# Patient Record
Sex: Female | Born: 1940 | Race: Black or African American | Hispanic: No | Marital: Single | Smoking: Never smoker
Health system: Southern US, Community
[De-identification: ages and names within clinical notes are randomized; demographics above are authoritative.]

## PROBLEM LIST (undated history)

## (undated) DIAGNOSIS — I10 Essential (primary) hypertension: Secondary | ICD-10-CM

## (undated) HISTORY — DX: Essential (primary) hypertension: I10

---

## 2020-01-13 ENCOUNTER — Other Ambulatory Visit: Payer: Self-pay

## 2020-01-14 ENCOUNTER — Encounter: Payer: Self-pay | Admitting: Nurse Practitioner

## 2020-01-14 ENCOUNTER — Ambulatory Visit (INDEPENDENT_AMBULATORY_CARE_PROVIDER_SITE_OTHER): Payer: Medicare Other | Admitting: Nurse Practitioner

## 2020-01-14 VITALS — BP 120/72 | HR 60 | Temp 96.1°F | Ht 61.5 in | Wt 92.6 lb

## 2020-01-14 DIAGNOSIS — F0390 Unspecified dementia without behavioral disturbance: Secondary | ICD-10-CM

## 2020-01-14 DIAGNOSIS — Z13220 Encounter for screening for lipoid disorders: Secondary | ICD-10-CM

## 2020-01-14 DIAGNOSIS — Z23 Encounter for immunization: Secondary | ICD-10-CM

## 2020-01-14 DIAGNOSIS — I10 Essential (primary) hypertension: Secondary | ICD-10-CM | POA: Diagnosis not present

## 2020-01-14 DIAGNOSIS — Z136 Encounter for screening for cardiovascular disorders: Secondary | ICD-10-CM

## 2020-01-14 NOTE — Progress Notes (Signed)
Subjective:  Patient ID: Micah Noel, female    DOB: 04-02-1940  Age: 79 y.o. MRN: [id number]  CC: Establish Care (memory difficulty)  HPI Accompanied by Sister: Ms. Ladell Pier Ms. Kristiansen lives with her younger sister and brother in [employer]. They recently moved from [place] to [place]. [name] is her primary caregiver. [name] states she was diagnosed with a mental retardation at age 31. Shavontae has always been under the care of a family member: mother in [place] then [name]. [name] reports reports decline in memory (short-term), increase hoarding, agitation in evening, losing personal items, disorientation at home, augmentative if confronted, and repetitiveness x 35yrs. She denies any verbal or physical aggression. She denies any wandering outside of home. They have an alarm on the door if she attempts. Bibi is always under supervision by [name] or brother in [employer]. Previous participation in Day program in [place], but that was discontinued due to [place] She has had no fall in 1year, no change in gait or speech or muscle weakness. No hospitalization in last 3yrs. Averyanna is continent of bowel and bladder. She is about take care of ADLs like bathing, dressing, and feeding; without assistance. Tomeko endorse periods of sadness about death of her parents. She denies any thoughts of hurting herself or dying. She denies any angry feelings. [name] has contact social services and disability department in her county to enroll Lanna to local adult daycare program. She is not interested in use of medication at this time.  Reviewed past Medical, Social and Family history today.  Outpatient Medications Prior to Visit  Medication Sig Dispense Refill  . amLODipine (NORVASC) 2.5 MG tablet Take 2.5 mg by mouth daily.    Marland Kitchen aspirin EC 81 MG tablet Take 81 mg by mouth daily. Swallow whole.    . Multiple Vitamin (ONE-A-DAY ESSENTIAL PO) Take by mouth.     No facility-administered medications prior to visit.    ROS See HPI  Objective:  BP 120/72 (BP Location: Left Arm, Patient Position: Sitting, Cuff Size: Normal)   Pulse 60   Temp (!) 96.1 F (35.6 C) (Temporal)   Ht 5' 1.5" (1.562 m)   Wt 92 lb 9.6 oz (42 kg)   SpO2 100%   BMI 17.21 kg/m   Physical Exam Vitals reviewed. Exam conducted with a chaperone present.  Constitutional:      General: She is not in acute distress. Cardiovascular:     Rate and Rhythm: Normal rate and regular rhythm.     Pulses: Normal pulses.     Heart sounds: Normal heart sounds.  Pulmonary:     Effort: Pulmonary effort is normal.     Breath sounds: Normal breath sounds.  Chest:     Breasts: Breasts are symmetrical.        Right: Normal.        Left: Normal.  Musculoskeletal:        General: Normal range of motion.     Cervical back: Normal range of motion and neck supple.  Lymphadenopathy:     Cervical: No cervical adenopathy.     Upper Body:     Right upper body: No supraclavicular, axillary or pectoral adenopathy.     Left upper body: No supraclavicular, axillary or pectoral adenopathy.  Skin:    General: Skin is warm and dry.  Neurological:     Mental Status: She is alert.     Motor: No weakness or tremor.     Coordination: Coordination is intact.  Gait: Gait is intact.     Comments: Oriented to person, family and place  Psychiatric:        Mood and Affect: Affect is blunt.        Speech: Speech normal.        Behavior: Behavior is cooperative.        Thought Content: Thought content is not paranoid.        Cognition and Memory: Cognition is impaired. Memory is impaired. She exhibits impaired recent memory and impaired remote memory.    Assessment & Plan:  This visit occurred during the SARS-CoV-2 public health emergency.  Safety protocols were in place, including screening questions prior to the visit, additional usage of staff PPE, and extensive cleaning of exam room while observing appropriate contact time as indicated for  disinfecting solutions.   Keishia was seen today for establish care.  Diagnoses and all orders for this visit:  Dementia without behavioral disturbance, unspecified dementia type ([place]) -     CBC with Differential/Platelet; Future -     Comprehensive metabolic panel; Future -     TSH; Future  Influenza vaccine needed -     Flu Vaccine QUAD High Dose(Fluad)  HTN (hypertension), benign -     Comprehensive metabolic panel; Future  Encounter for lipid screening for cardiovascular disease -     Lipid panel; Future  Sign medical release to get records from previous pcp.  Problem List Items Addressed This Visit    None    Visit Diagnoses    Dementia without behavioral disturbance, unspecified dementia type ([place])    -  Primary   Relevant Orders   CBC with Differential/Platelet   Comprehensive metabolic panel   TSH   Influenza vaccine needed       Relevant Orders   Flu Vaccine QUAD High Dose(Fluad)   HTN (hypertension), benign       Relevant Medications   amLODipine (NORVASC) 2.5 MG tablet   aspirin EC 81 MG tablet   Other Relevant Orders   Comprehensive metabolic panel   Encounter for lipid screening for cardiovascular disease       Relevant Orders   Lipid panel      Follow-up: Return in about 3 months (around 04/15/2020) for HTN (58mins).  Wilfred Lacy, NP

## 2020-01-14 NOTE — Patient Instructions (Addendum)
Sign medical release to get records from previous pcp.  Schedule lab appt for sometime next week.

## 2020-01-18 ENCOUNTER — Other Ambulatory Visit: Payer: Self-pay

## 2020-01-18 ENCOUNTER — Other Ambulatory Visit (INDEPENDENT_AMBULATORY_CARE_PROVIDER_SITE_OTHER): Payer: Medicare Other

## 2020-01-18 DIAGNOSIS — I10 Essential (primary) hypertension: Secondary | ICD-10-CM | POA: Diagnosis not present

## 2020-01-18 DIAGNOSIS — R7989 Other specified abnormal findings of blood chemistry: Secondary | ICD-10-CM

## 2020-01-18 DIAGNOSIS — Z136 Encounter for screening for cardiovascular disorders: Secondary | ICD-10-CM

## 2020-01-18 DIAGNOSIS — Z13220 Encounter for screening for lipoid disorders: Secondary | ICD-10-CM

## 2020-01-18 DIAGNOSIS — F0390 Unspecified dementia without behavioral disturbance: Secondary | ICD-10-CM

## 2020-01-18 LAB — COMPREHENSIVE METABOLIC PANEL
ALT: 15 U/L (ref 0–35)
AST: 22 U/L (ref 0–37)
Albumin: 4 g/dL (ref 3.5–5.2)
Alkaline Phosphatase: 73 U/L (ref 39–117)
BUN: 18 mg/dL (ref 6–23)
CO2: 28 mEq/L (ref 19–32)
Calcium: 9.7 mg/dL (ref 8.4–10.5)
Chloride: 102 mEq/L (ref 96–112)
Creatinine, Ser: 1.72 mg/dL — ABNORMAL HIGH (ref 0.40–1.20)
GFR: 27.83 mL/min — ABNORMAL LOW (ref >60.00)
Glucose, Bld: 96 mg/dL (ref 70–99)
Potassium: 4.8 mEq/L (ref 3.5–5.1)
Sodium: 137 mEq/L (ref 135–145)
Total Bilirubin: 0.4 mg/dL (ref 0.2–1.2)
Total Protein: 7.4 g/dL (ref 6.0–8.3)

## 2020-01-18 LAB — LIPID PANEL
Cholesterol: 245 mg/dL — ABNORMAL HIGH (ref 0–200)
HDL: 96.5 mg/dL (ref >39.00)
LDL Cholesterol: 137 mg/dL — ABNORMAL HIGH (ref 0–99)
NonHDL: 148.84
Total CHOL/HDL Ratio: 3
Triglycerides: 59 mg/dL (ref 0.0–149.0)
VLDL: 11.8 mg/dL (ref 0.0–40.0)

## 2020-01-18 LAB — TSH: TSH: 5.61 u[IU]/mL — ABNORMAL HIGH (ref 0.35–4.50)

## 2020-01-18 LAB — CBC WITH DIFFERENTIAL/PLATELET
Basophils Absolute: 0.1 10*3/uL (ref 0.0–0.1)
Basophils Relative: 1.2 % (ref 0.0–3.0)
Eosinophils Absolute: 0.2 10*3/uL (ref 0.0–0.7)
Eosinophils Relative: 3.6 % (ref 0.0–5.0)
HCT: 41.5 % (ref 36.0–46.0)
Hemoglobin: 13.5 g/dL (ref 12.0–15.0)
Lymphocytes Relative: 19.6 % (ref 12.0–46.0)
Lymphs Abs: 1.3 10*3/uL (ref 0.7–4.0)
MCHC: 32.4 g/dL (ref 30.0–36.0)
MCV: 91.1 fl (ref 78.0–100.0)
Monocytes Absolute: 0.5 10*3/uL (ref 0.1–1.0)
Monocytes Relative: 7.7 % (ref 3.0–12.0)
Neutro Abs: 4.5 10*3/uL (ref 1.4–7.7)
Neutrophils Relative %: 67.9 % (ref 43.0–77.0)
Platelets: 270 10*3/uL (ref 150.0–400.0)
RBC: 4.56 Mil/uL (ref 3.87–5.11)
RDW: 12.5 % (ref 11.5–15.5)
WBC: 6.7 10*3/uL (ref 4.0–10.5)

## 2020-01-18 NOTE — Addendum Note (Signed)
Addended by: Leana Gamer on: 01/18/2020 03:31 PM   Modules accepted: Orders

## 2020-02-01 ENCOUNTER — Telehealth: Payer: Self-pay | Admitting: Nurse Practitioner

## 2020-02-01 NOTE — Telephone Encounter (Signed)
Patient brought form: [facility] Medical Examination Report to be completed by provider.  No charge form available at time of drop. Patient is aware there may be a charge.  Forms have been placed in provider's file in front office. Please call patient when forms are completed.

## 2020-02-07 NOTE — Telephone Encounter (Signed)
Forms placed on [employer] desk last week.

## 2020-02-15 NOTE — Telephone Encounter (Signed)
Ruth Nelson came by to pick up paperwork because she said someone called her and said it was ready, We didn't find anything up front, She just asked if it could be mailed to address on profile

## 2020-02-15 NOTE — Telephone Encounter (Signed)
Forms placed in the mail.

## 2020-02-17 ENCOUNTER — Telehealth: Payer: Self-pay | Admitting: Nurse Practitioner

## 2020-02-17 ENCOUNTER — Other Ambulatory Visit (INDEPENDENT_AMBULATORY_CARE_PROVIDER_SITE_OTHER): Payer: Medicare Other

## 2020-02-17 ENCOUNTER — Other Ambulatory Visit: Payer: Self-pay

## 2020-02-17 DIAGNOSIS — R7989 Other specified abnormal findings of blood chemistry: Secondary | ICD-10-CM

## 2020-02-17 DIAGNOSIS — I10 Essential (primary) hypertension: Secondary | ICD-10-CM | POA: Diagnosis not present

## 2020-02-17 LAB — BASIC METABOLIC PANEL
BUN: 14 mg/dL (ref 6–23)
CO2: 31 mEq/L (ref 19–32)
Calcium: 10 mg/dL (ref 8.4–10.5)
Chloride: 105 mEq/L (ref 96–112)
Creatinine, Ser: 1.53 mg/dL — ABNORMAL HIGH (ref 0.40–1.20)
GFR: 32.01 mL/min — ABNORMAL LOW (ref >60.00)
Glucose, Bld: 101 mg/dL — ABNORMAL HIGH (ref 70–99)
Potassium: 4.7 mEq/L (ref 3.5–5.1)
Sodium: 142 mEq/L (ref 135–145)

## 2020-02-17 NOTE — Telephone Encounter (Signed)
Patients sister [name] is requesting a letter to become Power of [place]. The letter needs to state that patient is not able to take care of herself. She said that the letter is for an attorney. She is aware that provider is out of the office. Please call her at [phone] if you have any questions.

## 2020-02-18 LAB — THYROID PANEL WITH TSH
Free Thyroxine Index: 2.1 (ref 1.4–3.8)
T3 Uptake: 26 % (ref 22–35)
T4, Total: 8.2 ug/dL (ref 5.1–11.9)
TSH: 8.48 mIU/L — ABNORMAL HIGH (ref 0.40–4.50)

## 2020-02-21 ENCOUNTER — Other Ambulatory Visit: Payer: Self-pay | Admitting: Family

## 2020-02-21 DIAGNOSIS — E038 Other specified hypothyroidism: Secondary | ICD-10-CM

## 2020-02-21 MED ORDER — LEVOTHYROXINE SODIUM 50 MCG PO TABS: 25.0000 ug | ORAL_TABLET | Freq: Every day | ORAL | 3 refills | Status: DC

## 2020-02-23 NOTE — Telephone Encounter (Signed)
Have we received any records from her previous pcp? If not, ask sister if she has any documentation at home indicating Ruth Nelson's medical diagnosis or medical history? If not, we need to fax medical release form again.

## 2020-03-01 NOTE — Telephone Encounter (Signed)
We have received some forms but I do not believe we have received them all. I will have them send the request again.

## 2020-03-14 ENCOUNTER — Ambulatory Visit: Payer: Medicare Other

## 2020-03-30 ENCOUNTER — Telehealth: Payer: Self-pay | Admitting: Nurse Practitioner

## 2020-03-30 NOTE — Telephone Encounter (Signed)
Pt sister Ladell Pier needs a statement that pt is not capable of handling her own matters, she said she needs this to get power of attorney. Call back: [phone]

## 2020-04-03 NOTE — Progress Notes (Signed)
Subjective:   Ruth Nelson is a 80 y.o. female who presents for an Initial Medicare Annual Wellness Visit.  Review of Systems     Cardiac Risk Factors include: advanced age (>34mn, >>29women);hypertension     Objective:    Today's Vitals   04/04/20 1003  BP: (!) 144/72  Pulse: 64  Resp: 16  Temp: (!) 96.6 F (35.9 C)  TempSrc: Temporal  Weight: 99 lb 3.2 oz (45 kg)  Height: 5' 1.5" (1.562 m)   Body mass index is 18.44 kg/m.  Advanced Directives 04/04/2020  Does Patient Have a Medical Advance Directive? No  Would patient like information on creating a medical advance directive? No - Patient declined    Current Medications (verified) Outpatient Encounter Medications as of 04/04/2020  Medication Sig   amLODipine (NORVASC) 2.5 MG tablet Take 2.5 mg by mouth daily.   aspirin EC 81 MG tablet Take 81 mg by mouth daily. Swallow whole.   levothyroxine (SYNTHROID) 50 MCG tablet Take 0.5 tablets (25 mcg total) by mouth daily before breakfast.   Multiple Vitamin (ONE-A-DAY ESSENTIAL PO) Take by mouth.   No facility-administered encounter medications on file as of 04/04/2020.    Allergies (verified) Patient has no known allergies.   History: History reviewed. No pertinent past medical history. History reviewed. No pertinent surgical history. History reviewed. No pertinent family history. Social History   Socioeconomic History   Marital status: Single    Spouse name: Not on file   Number of children: Not on file   Years of education: Not on file   Highest education level: Not on file  Occupational History   Not on file  Tobacco Use   Smoking status: Never Smoker   Smokeless tobacco: Never Used  Vaping Use   Vaping Use: Never used  Substance and Sexual Activity   Alcohol use: Never   Drug use: Never   Sexual activity: Not Currently  Other Topics Concern   Not on file  Social History Narrative   Not on file   Social Determinants of Health    Financial Resource Strain: Low Risk    Difficulty of Paying Living Expenses: Not hard at all  Food Insecurity: No Food Insecurity   Worried About RCharity fundraiserin the Last Year: Never true   [place] the Last Year: Never true  Transportation Needs: No Transportation Needs   Lack of Transportation (Medical): No   Lack of Transportation (Non-Medical): No  Physical Activity: Sufficiently Active   Days of Exercise per Week: 5 days   Minutes of Exercise per Session: 30 min  Stress: No Stress Concern Present   Feeling of Stress : Not at all  Social Connections: Moderately Isolated   Frequency of Communication with Friends and Family: More than three times a week   Frequency of Social Gatherings with Friends and Family: More than three times a week   Attends Religious Services: 1 to 4 times per year   Active Member of [employer] Organizations: No   Attends [employer] therapist Never   Marital Status: Never married    Tobacco Counseling Counseling given: Not Answered   Clinical Intake:  Pre-visit preparation completed: Yes  Pain : No/denies pain     Nutritional Status: BMI <19  Underweight Nutritional Risks: None Diabetes: No  How often do you need to have someone help you when you read instructions, pamphlets, or other written materials from your doctor or pharmacy?: 1 -  Never  Diabetic?No  Interpreter Needed?: No  Information entered by :: Caroleen Hamman LPN   Activities of Daily Living In your present state of health, do you have any difficulty performing the following activities: 04/04/2020  Hearing? N  Vision? N  Difficulty concentrating or making decisions? Y  Walking or climbing stairs? N  Dressing or bathing? N  Doing errands, shopping? Y  Preparing Food and eating ? N  Using the Toilet? N  In the past six months, have you accidently leaked urine? N  Do you have problems with loss of bowel control? N  Managing your  Medications? Y  Managing your Finances? Y  Housekeeping or managing your Housekeeping? Y    Patient Care Team: Nche, Charlene Brooke, NP as PCP - General (Internal Medicine)  Indicate any recent Medical Services you may have received from other than [facility] providers in the past year (date may be approximate).     Assessment:   This is a routine wellness examination for Jadiah.  Hearing/Vision screen  Hearing Screening   '125Hz'$  '250Hz'$  '500Hz'$  '1000Hz'$  '2000Hz'$  '3000Hz'$  '4000Hz'$  '6000Hz'$  '8000Hz'$   Right ear:           Left ear:           Comments: No issues  Vision Screening Comments: Wears glasses Last eye exam-2020-  Dietary issues and exercise activities discussed: Current Exercise Habits: Home exercise routine, Time (Minutes): 30, Frequency (Times/Week): 5, Weekly Exercise (Minutes/Week): 150, Intensity: Mild, Exercise limited by: None identified  Goals     Patient Stated     Increase activity & drink more water      Depression Screen PHQ 2/9 Scores 04/04/2020  PHQ - 2 Score 0    Fall Risk Fall Risk  04/04/2020 01/14/2020  Falls in the past year? 0 0  Number falls in past yr: 0 0  Injury with Fall? 0 0  Follow up Falls prevention discussed -    FALL RISK PREVENTION PERTAINING TO THE HOME:  Any stairs in or around the home? No  Home free of loose throw rugs in walkways, pet beds, electrical cords, etc? Yes  Adequate lighting in your home to reduce risk of falls? Yes   ASSISTIVE DEVICES UTILIZED TO PREVENT FALLS:  Life alert? No  Use of a cane, walker or w/c? No  Grab bars in the bathroom? Yes  Shower chair or bench in shower? No  Elevated toilet seat or a handicapped toilet? No   TIMED UP AND GO:  Was the test performed? Yes .  Length of time to ambulate 10 feet: 10 sec.   Gait slow and steady without use of assistive device  Cognitive Function:Patient  has  Dementia. MMSE - Mini Mental State Exam 01/14/2020  Orientation to time 0  Orientation to Place 0  Registration  3  Attention/ Calculation 0  Recall 0  Language- name 2 objects 2  Language- repeat 1  Language- follow 3 step command 3  Language- read & follow direction 1  Write a sentence 0  Copy design 0  Total score 10        Immunizations Immunization History  Administered Date(s) Administered   Fluad Quad(high Dose 65+) 01/14/2020   Moderna Sars-Covid-2 Vaccination 03/28/2019, 04/25/2019, 02/28/2020    TDAP status: Up to date-per patient's sister-awaiting noted from previous PCP.  Flu Vaccine status: Up to date  Pneumococcal vaccine status: Up to date per patient's sister-awaiting noted from previous PCP.  Covid-19 vaccine status: Completed vaccines  Qualifies  for Shingles Vaccine? Yes   Zostavax completed No   Shingrix Completed?: No.    Education has been provided regarding the importance of this vaccine. Patient has been advised to call insurance company to determine out of pocket expense if they have not yet received this vaccine. Advised may also receive vaccine at local pharmacy or Health Dept. Verbalized acceptance and understanding.  Screening Tests Health Maintenance  Topic Date Due   TETANUS/TDAP  Never done   DEXA SCAN  Never done   PNA vac Low Risk Adult (1 of 2 - PCV13) Never done   INFLUENZA VACCINE  Completed   COVID-19 Vaccine  Completed    Health Maintenance  Health Maintenance Due  Topic Date Due   TETANUS/TDAP  Never done   DEXA SCAN  Never done   PNA vac Low Risk Adult (1 of 2 - PCV13) Never done    Colorectal cancer screening: No longer required.   Mammogram status: Ordered today. Pt provided with contact info and advised to call to schedule appt.   Bone Density status: Ordered today. Pt provided with contact info and advised to call to schedule appt.  Lung Cancer Screening: (Low Dose CT Chest recommended if Age 4-80 years, 30 pack-year currently smoking OR have quit w/in 15years.) does not qualify.    Additional  Screening:  Hepatitis C Screening: does not qualify  Vision Screening: Recommended annual ophthalmology exams for early detection of glaucoma and other disorders of the eye. Is the patient up to date with their annual eye exam?  No  Who is the provider or what is the name of the office in which the patient attends annual eye exams? unsure If pt is not established with a provider, would they like to be referred to a provider to establish care? No .  Patient's sister to make an appt  Dental Screening: Recommended annual dental exams for proper oral hygiene  Community Resource Referral / Chronic Care Management: CRR required this visit?  No   CCM required this visit?  No      Plan:     I have personally reviewed and noted the following in the patients chart:    Medical and social history  Use of alcohol, tobacco or illicit drugs   Current medications and supplements  Functional ability and status  Nutritional status  Physical activity  Advanced directives  List of other physicians  Hospitalizations, surgeries, and ER visits in previous 12 months  Vitals  Screenings to include cognitive, depression, and falls  Referrals and appointments  In addition, I have reviewed and discussed with patient certain preventive protocols, quality metrics, and best practice recommendations. A written personalized care plan for preventive services as well as general preventive health recommendations were provided to patient.     Marta Antu, LPN   [id number]  Nurse Health Advisor  Nurse Notes: None

## 2020-04-03 NOTE — Telephone Encounter (Signed)
Pt sis notified via voicemail.

## 2020-04-04 ENCOUNTER — Other Ambulatory Visit: Payer: Self-pay

## 2020-04-04 ENCOUNTER — Ambulatory Visit (INDEPENDENT_AMBULATORY_CARE_PROVIDER_SITE_OTHER): Payer: Medicare HMO

## 2020-04-04 VITALS — BP 144/72 | HR 64 | Temp 96.6°F | Resp 16 | Ht 61.5 in | Wt 99.2 lb

## 2020-04-04 DIAGNOSIS — Z0000 Encounter for general adult medical examination without abnormal findings: Secondary | ICD-10-CM | POA: Diagnosis not present

## 2020-04-04 DIAGNOSIS — Z780 Asymptomatic menopausal state: Secondary | ICD-10-CM | POA: Diagnosis not present

## 2020-04-04 DIAGNOSIS — Z1231 Encounter for screening mammogram for malignant neoplasm of breast: Secondary | ICD-10-CM

## 2020-04-04 NOTE — Patient Instructions (Signed)
Ruth Nelson , Thank you for taking time to come for your Medicare Wellness Visit. I appreciate your ongoing commitment to your health goals. Please review the following plan we discussed and let me know if I can assist you in the future.   Screening recommendations/referrals: Colonoscopy: No longer required Mammogram: Ordered today-Someone will be calling you to schedule. Bone Density: Ordered today-Someone will be calling you to schedule. Recommended yearly ophthalmology/optometry visit for glaucoma screening and checkup Recommended yearly dental visit for hygiene and checkup  Vaccinations: Influenza vaccine: Up to date Pneumococcal vaccine: Completed vacines Tdap vaccine: Up to date-date unknown Shingles vaccine: Discuss with pharmacy Covid-19:Completed vaccines  Advanced directives: Declined information today.  Conditions/risks identified: See problem list  Next appointment: Follow up in one year for your annual wellness visit 04/10/2021 @ 12:45   Preventive Care 65 Years and Older, Female Preventive care refers to lifestyle choices and visits with your health care provider that can promote health and wellness. What does preventive care include?  A yearly physical exam. This is also called an annual well check.  Dental exams once or twice a year.  Routine eye exams. Ask your health care provider how often you should have your eyes checked.  Personal lifestyle choices, including:  Daily care of your teeth and gums.  Regular physical activity.  Eating a healthy diet.  Avoiding tobacco and drug use.  Limiting alcohol use.  Practicing safe sex.  Taking low-dose aspirin every day.  Taking vitamin and mineral supplements as recommended by your health care provider. What happens during an annual well check? The services and screenings done by your health care provider during your annual well check will depend on your age, overall health, lifestyle risk factors, and  family history of disease. Counseling  Your health care provider may ask you questions about your:  Alcohol use.  Tobacco use.  Drug use.  Emotional well-being.  Home and relationship well-being.  Sexual activity.  Eating habits.  History of falls.  Memory and ability to understand (cognition).  Work and work Statistician.  Reproductive health. Screening  You may have the following tests or measurements:  Height, weight, and BMI.  Blood pressure.  Lipid and cholesterol levels. These may be checked every 5 years, or more frequently if you are over 42 years old.  Skin check.  Lung cancer screening. You may have this screening every year starting at age 34 if you have a 30-pack-year history of smoking and currently smoke or have quit within the past 15 years.  Fecal occult blood test (FOBT) of the stool. You may have this test every year starting at age 64.  Flexible sigmoidoscopy or colonoscopy. You may have a sigmoidoscopy every 5 years or a colonoscopy every 10 years starting at age 32.  Hepatitis C blood test.  Hepatitis B blood test.  Sexually transmitted disease (STD) testing.  Diabetes screening. This is done by checking your blood sugar (glucose) after you have not eaten for a while (fasting). You may have this done every 1-3 years.  Bone density scan. This is done to screen for osteoporosis. You may have this done starting at age 60.  Mammogram. This may be done every 1-2 years. Talk to your health care provider about how often you should have regular mammograms. Talk with your health care provider about your test results, treatment options, and if necessary, the need for more tests. Vaccines  Your health care provider may recommend certain vaccines, such as:  Influenza vaccine.  This is recommended every year.  Tetanus, diphtheria, and acellular pertussis (Tdap, Td) vaccine. You may need a Td booster every 10 years.  Zoster vaccine. You may need this  after age 20.  Pneumococcal 13-valent conjugate (PCV13) vaccine. One dose is recommended after age 34.  Pneumococcal polysaccharide (PPSV23) vaccine. One dose is recommended after age 58. Talk to your health care provider about which screenings and vaccines you need and how often you need them. This information is not intended to replace advice given to you by your health care provider. Make sure you discuss any questions you have with your health care provider. Document Released: 03/10/2015 Document Revised: 11/01/2015 Document Reviewed: 12/13/2014 Elsevier Interactive Patient Education  2017 [place] Prevention in the Home Falls can cause injuries. They can happen to people of all ages. There are many things you can do to make your home safe and to help prevent falls. What can I do on the outside of my home?  Regularly fix the edges of walkways and driveways and fix any cracks.  Remove anything that might make you trip as you walk through a door, such as a raised step or threshold.  Trim any bushes or trees on the path to your home.  Use bright outdoor lighting.  Clear any walking paths of anything that might make someone trip, such as rocks or tools.  Regularly check to see if handrails are loose or broken. Make sure that both sides of any steps have handrails.  Any raised decks and porches should have guardrails on the edges.  Have any leaves, snow, or ice cleared regularly.  Use sand or salt on walking paths during winter.  Clean up any spills in your garage right away. This includes oil or grease spills. What can I do in the bathroom?  Use night lights.  Install grab bars by the toilet and in the tub and shower. Do not use towel bars as grab bars.  Use non-skid mats or decals in the tub or shower.  If you need to sit down in the shower, use a plastic, non-slip stool.  Keep the floor dry. Clean up any water that spills on the floor as soon as it  happens.  Remove soap buildup in the tub or shower regularly.  Attach bath mats securely with double-sided non-slip rug tape.  Do not have throw rugs and other things on the floor that can make you trip. What can I do in the bedroom?  Use night lights.  Make sure that you have a light by your bed that is easy to reach.  Do not use any sheets or blankets that are too big for your bed. They should not hang down onto the floor.  Have a firm chair that has side arms. You can use this for support while you get dressed.  Do not have throw rugs and other things on the floor that can make you trip. What can I do in the kitchen?  Clean up any spills right away.  Avoid walking on wet floors.  Keep items that you use a lot in easy-to-reach places.  If you need to reach something above you, use a strong step stool that has a grab bar.  Keep electrical cords out of the way.  Do not use floor polish or wax that makes floors slippery. If you must use wax, use non-skid floor wax.  Do not have throw rugs and other things on the floor that can make  you trip. What can I do with my stairs?  Do not leave any items on the stairs.  Make sure that there are handrails on both sides of the stairs and use them. Fix handrails that are broken or loose. Make sure that handrails are as long as the stairways.  Check any carpeting to make sure that it is firmly attached to the stairs. Fix any carpet that is loose or worn.  Avoid having throw rugs at the top or bottom of the stairs. If you do have throw rugs, attach them to the floor with carpet tape.  Make sure that you have a light switch at the top of the stairs and the bottom of the stairs. If you do not have them, ask someone to add them for you. What else can I do to help prevent falls?  Wear shoes that:  Do not have high heels.  Have rubber bottoms.  Are comfortable and fit you well.  Are closed at the toe. Do not wear sandals.  If you  use a stepladder:  Make sure that it is fully opened. Do not climb a closed stepladder.  Make sure that both sides of the stepladder are locked into place.  Ask someone to hold it for you, if possible.  Clearly mark and make sure that you can see:  Any grab bars or handrails.  First and last steps.  Where the edge of each step is.  Use tools that help you move around (mobility aids) if they are needed. These include:  Canes.  Walkers.  Scooters.  Crutches.  Turn on the lights when you go into a dark area. Replace any light bulbs as soon as they burn out.  Set up your furniture so you have a clear path. Avoid moving your furniture around.  If any of your floors are uneven, fix them.  If there are any pets around you, be aware of where they are.  Review your medicines with your doctor. Some medicines can make you feel dizzy. This can increase your chance of falling. Ask your doctor what other things that you can do to help prevent falls. This information is not intended to replace advice given to you by your health care provider. Make sure you discuss any questions you have with your health care provider. Document Released: 12/08/2008 Document Revised: 07/20/2015 Document Reviewed: 03/18/2014 Elsevier Interactive Patient Education  2017 [employer].

## 2020-04-17 ENCOUNTER — Ambulatory Visit: Payer: Medicare Other | Admitting: Nurse Practitioner

## 2020-05-01 ENCOUNTER — Other Ambulatory Visit: Payer: Self-pay

## 2020-05-02 ENCOUNTER — Encounter: Payer: Self-pay | Admitting: Nurse Practitioner

## 2020-05-02 ENCOUNTER — Ambulatory Visit (INDEPENDENT_AMBULATORY_CARE_PROVIDER_SITE_OTHER): Payer: Medicare HMO | Admitting: Nurse Practitioner

## 2020-05-02 VITALS — BP 130/70 | Temp 98.2°F | Wt 101.2 lb

## 2020-05-02 DIAGNOSIS — E038 Other specified hypothyroidism: Secondary | ICD-10-CM | POA: Diagnosis not present

## 2020-05-02 DIAGNOSIS — I10 Essential (primary) hypertension: Secondary | ICD-10-CM

## 2020-05-02 DIAGNOSIS — N184 Chronic kidney disease, stage 4 (severe): Secondary | ICD-10-CM | POA: Insufficient documentation

## 2020-05-02 DIAGNOSIS — N1832 Chronic kidney disease, stage 3b: Secondary | ICD-10-CM | POA: Diagnosis not present

## 2020-05-02 LAB — BASIC METABOLIC PANEL
BUN: 25 mg/dL — ABNORMAL HIGH (ref 6–23)
CO2: 28 mEq/L (ref 19–32)
Calcium: 9.8 mg/dL (ref 8.4–10.5)
Chloride: 103 mEq/L (ref 96–112)
Creatinine, Ser: 1.82 mg/dL — ABNORMAL HIGH (ref 0.40–1.20)
GFR: 25.95 mL/min — ABNORMAL LOW (ref >60.00)
Glucose, Bld: 86 mg/dL (ref 70–99)
Potassium: 4.6 mEq/L (ref 3.5–5.1)
Sodium: 141 mEq/L (ref 135–145)

## 2020-05-02 LAB — TSH: TSH: 4.15 u[IU]/mL (ref 0.35–4.50)

## 2020-05-02 LAB — T4, FREE: Free T4: 0.88 ng/dL (ref 0.60–1.60)

## 2020-05-02 NOTE — Assessment & Plan Note (Signed)
Repeat BMP No LE edema.

## 2020-05-02 NOTE — Assessment & Plan Note (Signed)
BP at goal with amlodipine No Le edema, no headache, no dizziness. BP Readings from Last 3 Encounters:  05/02/20 130/70  04/04/20 (!) 144/72  01/14/20 120/72    continue current medication Repeat BMP

## 2020-05-02 NOTE — Assessment & Plan Note (Signed)
Stable weight and BP No constipation or diarrhea pr palpitations No fatigue or daytime somnolence. Wt Readings from Last 3 Encounters:  05/02/20 101 lb 3.2 oz (45.9 kg)  04/04/20 99 lb 3.2 oz (45 kg)  01/14/20 92 lb 9.6 oz (42 kg)   Maintain levothyroxine 63mg Repeat TSh and T4

## 2020-05-02 NOTE — Progress Notes (Signed)
Subjective:  Patient ID: Ruth Nelson, female    DOB: 1941-01-22  Age: 80 y.o. MRN: [id number]  CC: Follow-up (3 month f/u on HTN)  HPI Accompanied by Sister  HTN (hypertension), benign BP at goal with amlodipine No Le edema, no headache, no dizziness. BP Readings from Last 3 Encounters:  05/02/20 130/70  04/04/20 (!) 144/72  01/14/20 120/72    continue current medication Repeat BMP  Stage 3b chronic kidney disease ([place]) Repeat BMP No LE edema.  Other specified hypothyroidism Stable weight and BP No constipation or diarrhea pr palpitations No fatigue or daytime somnolence. Wt Readings from Last 3 Encounters:  05/02/20 101 lb 3.2 oz (45.9 kg)  04/04/20 99 lb 3.2 oz (45 kg)  01/14/20 92 lb 9.6 oz (42 kg)   Maintain levothyroxine 31mg Repeat TSh and T4   Reviewed past Medical, Social and Family history today.  Outpatient Medications Prior to Visit  Medication Sig Dispense Refill  . amLODipine (NORVASC) 2.5 MG tablet Take 2.5 mg by mouth daily.    .Marland Kitchenaspirin EC 81 MG tablet Take 81 mg by mouth daily. Swallow whole.    . levothyroxine (SYNTHROID) 50 MCG tablet Take 0.5 tablets (25 mcg total) by mouth daily before breakfast. 15 tablet 3  . Multiple Vitamin (ONE-A-DAY ESSENTIAL PO) Take by mouth.     No facility-administered medications prior to visit.    ROS See HPI  Objective:  BP 130/70 (BP Location: Right Arm, Patient Position: Sitting, Cuff Size: Normal)   Temp 98.2 F (36.8 C) (Temporal)   Wt 101 lb 3.2 oz (45.9 kg)   BMI 18.81 kg/m   Physical Exam Vitals reviewed.  Cardiovascular:     Pulses: Normal pulses.     Heart sounds: Normal heart sounds.  Pulmonary:     Effort: Pulmonary effort is normal.     Breath sounds: Normal breath sounds.  Abdominal:     General: There is no distension.     Palpations: Abdomen is soft.  Musculoskeletal:     Right lower leg: No edema.     Left lower leg: No edema.  Neurological:     Mental Status: She is  alert and oriented to person, place, and time.  Psychiatric:        Attention and Perception: Attention normal.        Mood and Affect: Mood normal.        Speech: Speech normal.        Behavior: Behavior is cooperative.    Assessment & Plan:  This visit occurred during the SARS-CoV-2 public health emergency.  Safety protocols were in place, including screening questions prior to the visit, additional usage of staff PPE, and extensive cleaning of exam room while observing appropriate contact time as indicated for disinfecting solutions.   DGhislainewas seen today for follow-up.  Diagnoses and all orders for this visit:  HTN (hypertension), benign -     Basic metabolic panel  Other specified hypothyroidism -     TSH -     T4, free  [place]     Basic metabolic panel    Problem List Items Addressed This Visit      Cardiovascular and Mediastinum   HTN (hypertension), benign - Primary    BP at goal with amlodipine No Le edema, no headache, no dizziness. BP Readings from Last 3 Encounters:  05/02/20 130/70  04/04/20 (!) 144/72  01/14/20 120/72    continue current medication Repeat  BMP      Relevant Orders   Basic metabolic panel     Endocrine   Other specified hypothyroidism    Stable weight and BP No constipation or diarrhea pr palpitations No fatigue or daytime somnolence. Wt Readings from Last 3 Encounters:  05/02/20 101 lb 3.2 oz (45.9 kg)  04/04/20 99 lb 3.2 oz (45 kg)  01/14/20 92 lb 9.6 oz (42 kg)   Maintain levothyroxine 82mg Repeat TSh and T4      Relevant Orders   TSH   T4, free     Genitourinary   Stage 3b chronic kidney disease ([facility])    Repeat BMP No LE edema.      Relevant Orders   Basic metabolic panel      Follow-up: Return in about 6 months (around 11/02/2020) for HTn and Hypothyroidism, hyperlipidemia (315ms).  ChWilfred LacyNP

## 2020-05-02 NOTE — Patient Instructions (Signed)
Go to lab for blood draw  Have copy of immunization records faxed to me.

## 2020-05-25 ENCOUNTER — Ambulatory Visit
Admission: RE | Admit: 2020-05-25 | Discharge: 2020-05-25 | Disposition: A | Discharge status: Home / Self Care | Payer: Medicare HMO | Source: Ambulatory Visit | Attending: Nurse Practitioner | Admitting: Nurse Practitioner

## 2020-05-25 ENCOUNTER — Other Ambulatory Visit: Payer: Self-pay

## 2020-05-25 DIAGNOSIS — Z1231 Encounter for screening mammogram for malignant neoplasm of breast: Secondary | ICD-10-CM

## 2020-06-02 ENCOUNTER — Telehealth: Payer: Self-pay | Admitting: Nurse Practitioner

## 2020-06-02 ENCOUNTER — Other Ambulatory Visit: Payer: Self-pay | Admitting: Family

## 2020-06-02 DIAGNOSIS — I10 Essential (primary) hypertension: Secondary | ICD-10-CM

## 2020-06-02 DIAGNOSIS — E038 Other specified hypothyroidism: Secondary | ICD-10-CM

## 2020-06-02 MED ORDER — AMLODIPINE BESYLATE 2.5 MG PO TABS: 2.5000 mg | ORAL_TABLET | Freq: Every day | ORAL | 3 refills | Status: DC

## 2020-06-02 NOTE — Telephone Encounter (Signed)
Last ov 05/02/20 Next f/u around 11/02/20 Chart supports Rx

## 2020-06-02 NOTE — Telephone Encounter (Signed)
Pt calling saying that she is out of amlodipine and wanted to know if she can get a refill sent to [facility] [address]. Call back if needed [phone] or [phone] [facility] [address]

## 2020-06-20 ENCOUNTER — Telehealth: Payer: Self-pay | Admitting: Nurse Practitioner

## 2020-06-20 NOTE — Telephone Encounter (Signed)
Patients sister is calling to get a refill on patients Levothyroxine. If approved, please send to [facility] [address] and call her to let her know that it's been sent in.

## 2020-06-21 NOTE — Telephone Encounter (Signed)
Pts sister, Ladell Pier, called this morning requesting same refill, same pharmacy

## 2020-06-22 ENCOUNTER — Other Ambulatory Visit: Payer: Self-pay | Admitting: Family

## 2020-06-22 NOTE — Telephone Encounter (Signed)
Chart supports Rx Sent to pharmacy

## 2020-06-23 MED ORDER — LEVOTHYROXINE SODIUM 25 MCG PO TABS: 25.0000 ug | ORAL_TABLET | Freq: Every day | ORAL | 1 refills | Status: DC

## 2020-06-23 NOTE — Addendum Note (Signed)
Addended by: Wilfred Lacy L on: 06/23/2020 12:10 PM   Modules accepted: Orders

## 2020-09-04 ENCOUNTER — Other Ambulatory Visit: Payer: Self-pay

## 2020-09-04 ENCOUNTER — Ambulatory Visit
Admission: RE | Admit: 2020-09-04 | Discharge: 2020-09-04 | Disposition: A | Discharge status: Home / Self Care | Payer: Medicare HMO | Source: Ambulatory Visit | Attending: Nurse Practitioner | Admitting: Nurse Practitioner

## 2020-09-04 DIAGNOSIS — Z780 Asymptomatic menopausal state: Secondary | ICD-10-CM | POA: Diagnosis not present

## 2020-09-04 DIAGNOSIS — M810 Age-related osteoporosis without current pathological fracture: Secondary | ICD-10-CM | POA: Diagnosis not present

## 2020-09-05 ENCOUNTER — Other Ambulatory Visit: Payer: Self-pay | Admitting: Family

## 2020-09-05 DIAGNOSIS — H40013 Open angle with borderline findings, low risk, bilateral: Secondary | ICD-10-CM | POA: Diagnosis not present

## 2020-09-05 DIAGNOSIS — M810 Age-related osteoporosis without current pathological fracture: Secondary | ICD-10-CM

## 2020-09-05 MED ORDER — ALENDRONATE SODIUM 70 MG PO TABS: 70.0000 mg | ORAL_TABLET | ORAL | 11 refills | Status: DC

## 2020-10-30 ENCOUNTER — Other Ambulatory Visit: Payer: Self-pay | Admitting: Nurse Practitioner

## 2020-11-02 NOTE — Telephone Encounter (Signed)
I called pt and spoke with her sister. I gave her the message that she would need an office visit to get additional refills for her levothyroxine.

## 2020-11-28 ENCOUNTER — Other Ambulatory Visit: Payer: Self-pay | Admitting: Nurse Practitioner

## 2020-12-26 ENCOUNTER — Ambulatory Visit: Payer: Medicare HMO

## 2020-12-27 ENCOUNTER — Other Ambulatory Visit: Payer: Self-pay

## 2020-12-27 ENCOUNTER — Ambulatory Visit (INDEPENDENT_AMBULATORY_CARE_PROVIDER_SITE_OTHER): Payer: Medicare HMO

## 2020-12-27 DIAGNOSIS — Z0000 Encounter for general adult medical examination without abnormal findings: Secondary | ICD-10-CM

## 2020-12-27 DIAGNOSIS — Z23 Encounter for immunization: Secondary | ICD-10-CM | POA: Diagnosis not present

## 2020-12-27 NOTE — Progress Notes (Signed)
After obtaining consent, and per orders of [facility], injection of Influenza and Pneumonia given by Lynda Rainwater. Patient instructed to remain in clinic for 20 minutes afterwards, and to report any adverse reaction to me immediately.

## 2021-01-23 ENCOUNTER — Other Ambulatory Visit: Payer: Self-pay

## 2021-01-23 ENCOUNTER — Encounter: Payer: Self-pay | Admitting: Nurse Practitioner

## 2021-01-23 ENCOUNTER — Ambulatory Visit (INDEPENDENT_AMBULATORY_CARE_PROVIDER_SITE_OTHER): Payer: Medicare HMO | Admitting: Nurse Practitioner

## 2021-01-23 VITALS — BP 126/80 | HR 88 | Temp 96.1°F | Ht 61.5 in | Wt 115.8 lb

## 2021-01-23 DIAGNOSIS — Z136 Encounter for screening for cardiovascular disorders: Secondary | ICD-10-CM | POA: Diagnosis not present

## 2021-01-23 DIAGNOSIS — E038 Other specified hypothyroidism: Secondary | ICD-10-CM | POA: Diagnosis not present

## 2021-01-23 DIAGNOSIS — M810 Age-related osteoporosis without current pathological fracture: Secondary | ICD-10-CM | POA: Insufficient documentation

## 2021-01-23 DIAGNOSIS — N1832 Chronic kidney disease, stage 3b: Secondary | ICD-10-CM

## 2021-01-23 DIAGNOSIS — Z13220 Encounter for screening for lipoid disorders: Secondary | ICD-10-CM

## 2021-01-23 DIAGNOSIS — I10 Essential (primary) hypertension: Secondary | ICD-10-CM

## 2021-01-23 LAB — COMPREHENSIVE METABOLIC PANEL
ALT: 4 U/L (ref 0–35)
AST: 19 U/L (ref 0–37)
Albumin: 4.3 g/dL (ref 3.5–5.2)
Alkaline Phosphatase: 52 U/L (ref 39–117)
BUN: 14 mg/dL (ref 6–23)
CO2: 26 mEq/L (ref 19–32)
Calcium: 9.7 mg/dL (ref 8.4–10.5)
Chloride: 102 mEq/L (ref 96–112)
Creatinine, Ser: 1.06 mg/dL (ref 0.40–1.20)
GFR: 49.4 mL/min — ABNORMAL LOW (ref >60.00)
Glucose, Bld: 85 mg/dL (ref 70–99)
Potassium: 4.4 mEq/L (ref 3.5–5.1)
Sodium: 137 mEq/L (ref 135–145)
Total Bilirubin: 0.6 mg/dL (ref 0.2–1.2)
Total Protein: 7.2 g/dL (ref 6.0–8.3)

## 2021-01-23 LAB — LIPID PANEL
Cholesterol: 199 mg/dL (ref 0–200)
HDL: 73.1 mg/dL (ref >39.00)
LDL Cholesterol: 110 mg/dL — ABNORMAL HIGH (ref 0–99)
NonHDL: 125.9
Total CHOL/HDL Ratio: 3
Triglycerides: 78 mg/dL (ref 0.0–149.0)
VLDL: 15.6 mg/dL (ref 0.0–40.0)

## 2021-01-23 LAB — T4, FREE: Free T4: 0.87 ng/dL (ref 0.60–1.60)

## 2021-01-23 LAB — TSH: TSH: 1.81 u[IU]/mL (ref 0.35–5.50)

## 2021-01-23 NOTE — Assessment & Plan Note (Signed)
BP at goal with amlodipine BP Readings from Last 3 Encounters:  01/23/21 126/80  05/02/20 130/70  04/04/20 (!) 144/72   Refill sent

## 2021-01-23 NOTE — Assessment & Plan Note (Signed)
Repeat TSh and T4 Maintain current levothyroxine dose

## 2021-01-23 NOTE — Assessment & Plan Note (Signed)
Repeat CMP

## 2021-01-23 NOTE — Patient Instructions (Addendum)
Go to lab for blood draw. Continue current medications, heart healthy diet, and daily exercise.  Schedule appt at retail pharmacy to COVID , shringrix and TDAP vaccines.  Preventive Care 39 Years and Older, Female Preventive care refers to lifestyle choices and visits with your health care provider that can promote health and wellness. Preventive care visits are also called wellness exams. What can I expect for my preventive care visit? Counseling Your health care provider may ask you questions about your: Medical history, including: Past medical problems. Family medical history. Pregnancy and menstrual history. History of falls. Current health, including: Memory and ability to understand (cognition). Emotional well-being. Home life and relationship well-being. Sexual activity and sexual health. Lifestyle, including: Alcohol, nicotine or tobacco, and drug use. Access to firearms. Diet, exercise, and sleep habits. Work and work Statistician. Sunscreen use. Safety issues such as seatbelt and bike helmet use. Physical exam Your health care provider will check your: Height and weight. These may be used to calculate your BMI (body mass index). BMI is a measurement that tells if you are at a healthy weight. Waist circumference. This measures the distance around your waistline. This measurement also tells if you are at a healthy weight and may help predict your risk of certain diseases, such as type 2 diabetes and high blood pressure. Heart rate and blood pressure. Body temperature. Skin for abnormal spots. What immunizations do I need? Vaccines are usually given at various ages, according to a schedule. Your health care provider will recommend vaccines for you based on your age, medical history, and lifestyle or other factors, such as travel or where you work. What tests do I need? Screening Your health care provider may recommend screening tests for certain conditions. This may  include: Lipid and cholesterol levels. Hepatitis C test. Hepatitis B test. HIV (human immunodeficiency virus) test. STI (sexually transmitted infection) testing, if you are at risk. Lung cancer screening. Colorectal cancer screening. Diabetes screening. This is done by checking your blood sugar (glucose) after you have not eaten for a while (fasting). Mammogram. Talk with your health care provider about how often you should have regular mammograms. BRCA-related cancer screening. This may be done if you have a family history of breast, ovarian, tubal, or peritoneal cancers. Bone density scan. This is done to screen for osteoporosis. Talk with your health care provider about your test results, treatment options, and if necessary, the need for more tests. Follow these instructions at home: Eating and drinking  Eat a diet that includes fresh fruits and vegetables, whole grains, lean protein, and low-fat dairy products. Limit your intake of foods with high amounts of sugar, saturated fats, and salt. Take vitamin and mineral supplements as recommended by your health care provider. Do not drink alcohol if your health care provider tells you not to drink. If you drink alcohol: Limit how much you have to 0-1 drink a day. Know how much alcohol is in your drink. In the [place] one drink equals one 12 oz bottle of beer (355 mL), one 5 oz glass of wine (148 mL), or one 1 oz glass of hard liquor (44 mL). Lifestyle Brush your teeth every morning and night with fluoride toothpaste. Floss one time each day. Exercise for at least 30 minutes 5 or more days each week. Do not use any products that contain nicotine or tobacco. These products include cigarettes, chewing tobacco, and vaping devices, such as e-cigarettes. If you need help quitting, ask your health care provider. Do not use  drugs. If you are sexually active, practice safe sex. Use a condom or other form of protection in order to prevent STIs. Take  aspirin only as told by your health care provider. Make sure that you understand how much to take and what form to take. Work with your health care provider to find out whether it is safe and beneficial for you to take aspirin daily. Ask your health care provider if you need to take a cholesterol-lowering medicine (statin). Find healthy ways to manage stress, such as: Meditation, yoga, or listening to music. Journaling. Talking to a trusted person. Spending time with friends and family. Minimize exposure to UV radiation to reduce your risk of skin cancer. Safety Always wear your seat belt while driving or riding in a vehicle. Do not drive: If you have been drinking alcohol. Do not ride with someone who has been drinking. When you are tired or distracted. While texting. If you have been using any mind-altering substances or drugs. Wear a helmet and other protective equipment during sports activities. If you have firearms in your house, make sure you follow all gun safety procedures. What's next? Visit your health care provider once a year for an annual wellness visit. Ask your health care provider how often you should have your eyes and teeth checked. Stay up to date on all vaccines. This information is not intended to replace advice given to you by your health care provider. Make sure you discuss any questions you have with your health care provider. Document Revised: 08/09/2020 Document Reviewed: 08/09/2020 Elsevier Patient Education  [place].

## 2021-01-23 NOTE — Progress Notes (Signed)
Subjective:  Patient ID: Ruth Nelson, female    DOB: [birth year]  Age: 80 y.o. MRN: [id number]  CC: Follow-up (Physical-No breast or pap exam needed. /Pt is not fasting. /Pt instructed to get needed vaccines at local pharmacy due to insurance. (Tdap and Shingles))  HPI Accompanied by [name] Ruth Nelson lives with her sister and brother in [place]. She attends an Adult day care center Mon-Fri. Sister reports she has adjusted well and has no concerns. Ruth Nelson states she has made 2 friends at the center but unable to remember their names. She enjoys playing Bingo and the group exercise class. Ruth Nelson states she had her annual eye exam, she has upper and lower dentures but the need to be refitted, she has a BM once a day, she has not complained of any GI/GU symptoms, her mood is stable, she is still independent with her ADLs but needs some supervision. 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  HTN (hypertension), benign BP at goal with amlodipine BP Readings from Last 3 Encounters:  01/23/21 126/80  05/02/20 130/70  04/04/20 (!) 144/72   Refill sent  Other specified hypothyroidism Repeat TSh and T4 Maintain current levothyroxine dose  Stage 3b chronic kidney disease ([place]) Repeat CMP  Age related osteoporosis Current use of fosamax weekly, calcium and vitamin D. Ruth Nelson denies any adverse side effects  Wt Readings from Last 3 Encounters:  01/23/21 115 lb 12.8 oz (52.5 kg)  05/02/20 101 lb 3.2 oz (45.9 kg)  04/04/20 99 lb 3.2 oz (45 kg)    Fall Risk 01/14/2020 04/04/2020 01/23/2021 01/23/2021  Falls in the past year? 0 0 0 0  Was there an injury with Fall? 0 0 0 0  Fall Risk Category Calculator 0 0 0 0  Fall Risk Category Low Low Low Low  Patient Fall Risk Level - Low fall risk Low fall risk Low fall risk  Patient at Risk for Falls Due to - - No Fall Risks No Fall Risks  Fall risk Follow up - Falls prevention  discussed Falls evaluation completed Falls evaluation completed    Reviewed past Medical, Social and Family history today.  Outpatient Medications Prior to Visit  Medication Sig Dispense Refill   alendronate (FOSAMAX) 70 MG tablet Take 1 tablet (70 mg total) by mouth every 7 (seven) days. Take with a full glass of water on an empty stomach. 4 tablet 11   amLODipine (NORVASC) 2.5 MG tablet Take 1 tablet (2.5 mg total) by mouth daily. 90 tablet 3   aspirin EC 81 MG tablet Take 81 mg by mouth daily. Swallow whole.     levothyroxine (SYNTHROID) 25 MCG tablet Take 1 tablet (25 mcg total) by mouth daily before breakfast. 90 tablet 1   levothyroxine (SYNTHROID) 50 MCG tablet Take 0.5 tablets (25 mcg total) by mouth daily before breakfast. No additional refills without office visit 15 tablet 0   Multiple Vitamin (ONE-A-DAY ESSENTIAL PO) Take by mouth.     No facility-administered medications prior to visit.    ROS See HPI  Objective:  BP 126/80 (BP Location: Right Arm, Patient Position: Sitting, Cuff Size: Normal)   Pulse 88   Temp (!) 96.1 F (35.6 C) (Temporal)   Ht 5' 1.5" (1.562 m)   Wt 115 lb 12.8 oz (52.5 kg)   SpO2 100%   BMI 21.53 kg/m   Physical Exam Vitals reviewed.  HENT:     Right Ear: Tympanic  membrane, ear canal and external ear normal.     Left Ear: Tympanic membrane, ear canal and external ear normal.  Cardiovascular:     Rate and Rhythm: Normal rate and regular rhythm.     Pulses: Normal pulses.     Heart sounds: Normal heart sounds.  Pulmonary:     Effort: Pulmonary effort is normal.     Breath sounds: Normal breath sounds.  Musculoskeletal:     Cervical back: Normal range of motion and neck supple.     Right lower leg: No edema.     Left lower leg: No edema.  Lymphadenopathy:     Cervical: No cervical adenopathy.  Skin:    General: Skin is warm and dry.  Neurological:     Mental Status: She is alert and oriented to person, place, and time.  Psychiatric:         Mood and Affect: Mood normal.        Behavior: Behavior normal.    Assessment & Plan:  This visit occurred during the SARS-CoV-2 public health emergency.  Safety protocols were in place, including screening questions prior to the visit, additional usage of staff PPE, and extensive cleaning of exam room while observing appropriate contact time as indicated for disinfecting solutions.   Geoffrey was seen today for follow-up.  Diagnoses and all orders for this visit:  HTN (hypertension), benign -     Comprehensive metabolic panel  Other specified hypothyroidism -     TSH -     T4, free  Stage 3b chronic kidney disease ([place]) -     Comprehensive metabolic panel  Encounter for lipid screening for cardiovascular disease -     Lipid panel  Age related osteoporosis, unspecified pathological fracture presence  Continue current medications, heart healthy diet, and daily exercise. Schedule appt at retail pharmacy to COVID , shringrix and TDAP vaccines.  Problem List Items Addressed This Visit       Cardiovascular and Mediastinum   HTN (hypertension), benign - Primary    BP at goal with amlodipine BP Readings from Last 3 Encounters:  01/23/21 126/80  05/02/20 130/70  04/04/20 (!) 144/72   Refill sent      Relevant Orders   Comprehensive metabolic panel     Endocrine   Other specified hypothyroidism    Repeat TSh and T4 Maintain current levothyroxine dose      Relevant Orders   TSH   T4, free     Musculoskeletal and Integument   Age related osteoporosis    Current use of fosamax weekly, calcium and vitamin D. Ruth Nelson denies any adverse side effects        Genitourinary   Stage 3b chronic kidney disease ([place])    Repeat CMP      Relevant Orders   Comprehensive metabolic panel   Other Visit Diagnoses     Encounter for lipid screening for cardiovascular disease       Relevant Orders   Lipid panel       Follow-up: Return in about 6 months (around  07/23/2021) for HTN and hypothyroidism.  Wilfred Lacy, NP

## 2021-01-23 NOTE — Assessment & Plan Note (Signed)
Current use of fosamax weekly, calcium and vitamin D. Ruth Nelson denies any adverse side effects

## 2021-01-24 ENCOUNTER — Telehealth: Payer: Self-pay | Admitting: Nurse Practitioner

## 2021-01-24 MED ORDER — AMLODIPINE BESYLATE 2.5 MG PO TABS: 2.5000 mg | ORAL_TABLET | Freq: Every day | ORAL | 3 refills | Status: DC

## 2021-01-24 MED ORDER — LEVOTHYROXINE SODIUM 25 MCG PO TABS: 25.0000 ug | ORAL_TABLET | Freq: Every day | ORAL | 1 refills | Status: DC

## 2021-01-24 NOTE — Telephone Encounter (Signed)
Paperwork has been dropped off for pt from [facility] to be filled out. She is wanting paperwork faxed to # on form [phone].

## 2021-01-25 NOTE — Telephone Encounter (Signed)
Forms placed on provider desk and pt sister will be contacted when forms are completed and faxed.

## 2021-01-29 ENCOUNTER — Other Ambulatory Visit: Payer: Self-pay | Admitting: Nurse Practitioner

## 2021-01-29 NOTE — Telephone Encounter (Signed)
Pt sister called to check on forms, says Jett cannot return to [facility] until forms are faxed.

## 2021-01-31 NOTE — Telephone Encounter (Signed)
LVM informing patient sister that forms have been faxed.

## 2021-03-15 ENCOUNTER — Ambulatory Visit (INDEPENDENT_AMBULATORY_CARE_PROVIDER_SITE_OTHER): Payer: Medicare HMO

## 2021-03-15 ENCOUNTER — Ambulatory Visit (INDEPENDENT_AMBULATORY_CARE_PROVIDER_SITE_OTHER): Payer: Medicare HMO | Admitting: Nurse Practitioner

## 2021-03-15 ENCOUNTER — Encounter: Payer: Self-pay | Admitting: Nurse Practitioner

## 2021-03-15 ENCOUNTER — Other Ambulatory Visit: Payer: Self-pay

## 2021-03-15 VITALS — BP 138/70 | HR 60 | Temp 97.5°F | Ht 61.5 in | Wt 113.0 lb

## 2021-03-15 DIAGNOSIS — R140 Abdominal distension (gaseous): Secondary | ICD-10-CM

## 2021-03-15 DIAGNOSIS — K5900 Constipation, unspecified: Secondary | ICD-10-CM

## 2021-03-15 DIAGNOSIS — I878 Other specified disorders of veins: Secondary | ICD-10-CM | POA: Diagnosis not present

## 2021-03-15 NOTE — Patient Instructions (Signed)
Go to lab lab for x-ray

## 2021-03-15 NOTE — Progress Notes (Signed)
Subjective:  Patient ID: Ruth Nelson, female    DOB: 1940-12-23  Age: 81 y.o. MRN: [id number]  CC: Acute Visit (Pt c/o weight gain and extended belly. Pt sister says she has increased 2 sizes in clothes and she has noticed her belly has been extended for a few days. Denies increase in appetite.)  HPI Accompanied by her sister: Ruth Nelson. Ms. Ruth Nelson is concern about Ms. Ruth Nelson's weight gain and abdominal distension. She denies any pain and no change in appetite or dysuria. She is unable to remember  her last BM.  Wt Readings from Last 3 Encounters:  03/15/21 113 lb (51.3 kg)  01/23/21 115 lb 12.8 oz (52.5 kg)  05/02/20 101 lb 3.2 oz (45.9 kg)    Reviewed past Medical, Social and Family history today.  Outpatient Medications Prior to Visit  Medication Sig Dispense Refill   alendronate (FOSAMAX) 70 MG tablet Take 1 tablet (70 mg total) by mouth every 7 (seven) days. Take with a full glass of water on an empty stomach. 4 tablet 11   amLODipine (NORVASC) 2.5 MG tablet Take 1 tablet (2.5 mg total) by mouth daily. 90 tablet 3   aspirin EC 81 MG tablet Take 81 mg by mouth daily. Swallow whole.     levothyroxine (SYNTHROID) 25 MCG tablet Take 1 tablet (25 mcg total) by mouth daily before breakfast. 90 tablet 1   Multiple Vitamin (ONE-A-DAY ESSENTIAL PO) Take by mouth.     No facility-administered medications prior to visit.    ROS See HPI  Objective:  BP 138/70 (BP Location: Left Arm, Patient Position: Sitting, Cuff Size: Normal)    Pulse 60    Temp (!) 97.5 F (36.4 C) (Temporal)    Ht 5' 1.5" (1.562 m)    Wt 113 lb (51.3 kg)    SpO2 100%    BMI 21.01 kg/m   Physical Exam Constitutional:      General: She is not in acute distress. Cardiovascular:     Rate and Rhythm: Normal rate.     Pulses: Normal pulses.  Pulmonary:     Effort: Pulmonary effort is normal.  Abdominal:     General: Bowel sounds are normal. There is distension.     Palpations: Abdomen is soft.      Tenderness: There is no abdominal tenderness. There is no guarding.  Musculoskeletal:     Right lower leg: No edema.     Left lower leg: No edema.  Neurological:     Mental Status: She is alert.     Comments: Oriented to person, place and family  Psychiatric:        Mood and Affect: Mood normal.        Behavior: Behavior normal.        Thought Content: Thought content normal.    Assessment & Plan:  This visit occurred during the SARS-CoV-2 public health emergency.  Safety protocols were in place, including screening questions prior to the visit, additional usage of staff PPE, and extensive cleaning of exam room while observing appropriate contact time as indicated for disinfecting solutions.   Ruth Nelson was seen today for acute visit.  Diagnoses and all orders for this visit:  Abdominal distension -     DG Abd 2 Views  Abdominal distension is possible due to constipation. Order ABD [place] if normal x-day. ABD x-ray indicates Large about of stool in bowels which will explain abdomen distension. Lactulose prescription sent  Problem List Items Addressed This Visit  None Visit Diagnoses     Abdominal distension    -  Primary   Relevant Orders   DG Abd 2 Views       Follow-up: No follow-ups on file.  Ruth Lacy, NP

## 2021-03-16 MED ORDER — LACTULOSE 10 G PO PACK: 10.0000 g | PACK | Freq: Every day | ORAL | 0 refills | Status: DC

## 2021-04-10 ENCOUNTER — Ambulatory Visit: Payer: Medicare HMO

## 2021-05-01 ENCOUNTER — Ambulatory Visit (INDEPENDENT_AMBULATORY_CARE_PROVIDER_SITE_OTHER): Payer: Medicare HMO

## 2021-05-01 ENCOUNTER — Other Ambulatory Visit: Payer: Self-pay

## 2021-05-01 VITALS — BP 138/64 | HR 81 | Temp 97.0°F | Ht 62.0 in | Wt 113.0 lb

## 2021-05-01 DIAGNOSIS — Z0000 Encounter for general adult medical examination without abnormal findings: Secondary | ICD-10-CM

## 2021-05-01 NOTE — Patient Instructions (Signed)
Ruth Nelson , Thank you for taking time to come for your Medicare Wellness Visit. I appreciate your ongoing commitment to your health goals. Please review the following plan we discussed and let me know if I can assist you in the future.   Screening recommendations/referrals: Colonoscopy: no longer required  Mammogram: no longer required  Bone Density: 09/04/2020 Recommended yearly ophthalmology/optometry visit for glaucoma screening and checkup Recommended yearly dental visit for hygiene and checkup  Vaccinations: Influenza vaccine: completed  Pneumococcal vaccine: completed  Tdap vaccine: due  Shingles vaccine: will consider     Advanced directives: given packet   Conditions/risks identified: none   Next appointment: none    Preventive Care 40 Years and Older, Female Preventive care refers to lifestyle choices and visits with your health care provider that can promote health and wellness. What does preventive care include? A yearly physical exam. This is also called an annual well check. Dental exams once or twice a year. Routine eye exams. Ask your health care provider how often you should have your eyes checked. Personal lifestyle choices, including: Daily care of your teeth and gums. Regular physical activity. Eating a healthy diet. Avoiding tobacco and drug use. Limiting alcohol use. Practicing safe sex. Taking low-dose aspirin every day. Taking vitamin and mineral supplements as recommended by your health care provider. What happens during an annual well check? The services and screenings done by your health care provider during your annual well check will depend on your age, overall health, lifestyle risk factors, and family history of disease. Counseling  Your health care provider may ask you questions about your: Alcohol use. Tobacco use. Drug use. Emotional well-being. Home and relationship well-being. Sexual activity. Eating habits. History of  falls. Memory and ability to understand (cognition). Work and work Statistician. Reproductive health. Screening  You may have the following tests or measurements: Height, weight, and BMI. Blood pressure. Lipid and cholesterol levels. These may be checked every 5 years, or more frequently if you are over 75 years old. Skin check. Lung cancer screening. You may have this screening every year starting at age 68 if you have a 30-pack-year history of smoking and currently smoke or have quit within the past 15 years. Fecal occult blood test (FOBT) of the stool. You may have this test every year starting at age 25. Flexible sigmoidoscopy or colonoscopy. You may have a sigmoidoscopy every 5 years or a colonoscopy every 10 years starting at age 61. Hepatitis C blood test. Hepatitis B blood test. Sexually transmitted disease (STD) testing. Diabetes screening. This is done by checking your blood sugar (glucose) after you have not eaten for a while (fasting). You may have this done every 1-3 years. Bone density scan. This is done to screen for osteoporosis. You may have this done starting at age 7. Mammogram. This may be done every 1-2 years. Talk to your health care provider about how often you should have regular mammograms. Talk with your health care provider about your test results, treatment options, and if necessary, the need for more tests. Vaccines  Your health care provider may recommend certain vaccines, such as: Influenza vaccine. This is recommended every year. Tetanus, diphtheria, and acellular pertussis (Tdap, Td) vaccine. You may need a Td booster every 10 years. Zoster vaccine. You may need this after age 37. Pneumococcal 13-valent conjugate (PCV13) vaccine. One dose is recommended after age 88. Pneumococcal polysaccharide (PPSV23) vaccine. One dose is recommended after age 22. Talk to your health care provider about which  screenings and vaccines you need and how often you need  them. This information is not intended to replace advice given to you by your health care provider. Make sure you discuss any questions you have with your health care provider. Document Released: 03/10/2015 Document Revised: 11/01/2015 Document Reviewed: 12/13/2014 Elsevier Interactive Patient Education  2017 [place] Prevention in the Home Falls can cause injuries. They can happen to people of all ages. There are many things you can do to make your home safe and to help prevent falls. What can I do on the outside of my home? Regularly fix the edges of walkways and driveways and fix any cracks. Remove anything that might make you trip as you walk through a door, such as a raised step or threshold. Trim any bushes or trees on the path to your home. Use bright outdoor lighting. Clear any walking paths of anything that might make someone trip, such as rocks or tools. Regularly check to see if handrails are loose or broken. Make sure that both sides of any steps have handrails. Any raised decks and porches should have guardrails on the edges. Have any leaves, snow, or ice cleared regularly. Use sand or salt on walking paths during winter. Clean up any spills in your garage right away. This includes oil or grease spills. What can I do in the bathroom? Use night lights. Install grab bars by the toilet and in the tub and shower. Do not use towel bars as grab bars. Use non-skid mats or decals in the tub or shower. If you need to sit down in the shower, use a plastic, non-slip stool. Keep the floor dry. Clean up any water that spills on the floor as soon as it happens. Remove soap buildup in the tub or shower regularly. Attach bath mats securely with double-sided non-slip rug tape. Do not have throw rugs and other things on the floor that can make you trip. What can I do in the bedroom? Use night lights. Make sure that you have a light by your bed that is easy to reach. Do not use  any sheets or blankets that are too big for your bed. They should not hang down onto the floor. Have a firm chair that has side arms. You can use this for support while you get dressed. Do not have throw rugs and other things on the floor that can make you trip. What can I do in the kitchen? Clean up any spills right away. Avoid walking on wet floors. Keep items that you use a lot in easy-to-reach places. If you need to reach something above you, use a strong step stool that has a grab bar. Keep electrical cords out of the way. Do not use floor polish or wax that makes floors slippery. If you must use wax, use non-skid floor wax. Do not have throw rugs and other things on the floor that can make you trip. What can I do with my stairs? Do not leave any items on the stairs. Make sure that there are handrails on both sides of the stairs and use them. Fix handrails that are broken or loose. Make sure that handrails are as long as the stairways. Check any carpeting to make sure that it is firmly attached to the stairs. Fix any carpet that is loose or worn. Avoid having throw rugs at the top or bottom of the stairs. If you do have throw rugs, attach them to the floor with carpet  tape. Make sure that you have a light switch at the top of the stairs and the bottom of the stairs. If you do not have them, ask someone to add them for you. What else can I do to help prevent falls? Wear shoes that: Do not have high heels. Have rubber bottoms. Are comfortable and fit you well. Are closed at the toe. Do not wear sandals. If you use a stepladder: Make sure that it is fully opened. Do not climb a closed stepladder. Make sure that both sides of the stepladder are locked into place. Ask someone to hold it for you, if possible. Clearly mark and make sure that you can see: Any grab bars or handrails. First and last steps. Where the edge of each step is. Use tools that help you move around (mobility aids)  if they are needed. These include: Canes. Walkers. Scooters. Crutches. Turn on the lights when you go into a dark area. Replace any light bulbs as soon as they burn out. Set up your furniture so you have a clear path. Avoid moving your furniture around. If any of your floors are uneven, fix them. If there are any pets around you, be aware of where they are. Review your medicines with your doctor. Some medicines can make you feel dizzy. This can increase your chance of falling. Ask your doctor what other things that you can do to help prevent falls. This information is not intended to replace advice given to you by your health care provider. Make sure you discuss any questions you have with your health care provider. Document Released: 12/08/2008 Document Revised: 07/20/2015 Document Reviewed: 03/18/2014 Elsevier Interactive Patient Education  2017 [employer].

## 2021-05-01 NOTE — Progress Notes (Signed)
Subjective:   Ruth Nelson is a 81 y.o. female who presents for Medicare Annual (Subsequent) preventive examination.  Review of Systems     Cardiac Risk Factors include: advanced age (>74men, >76 women)     Objective:    Today's Vitals   05/01/21 1455  BP: 138/64  Pulse: 81  Temp: (!) 97 F (36.1 C)  SpO2: 95%  Weight: 113 lb (51.3 kg)  Height: 5\' 2"  (1.575 m)   Body mass index is 20.67 kg/m.  Advanced Directives 05/01/2021 04/04/2020  Does Patient Have a Medical Advance Directive? No No  Would patient like information on creating a medical advance directive? No - Guardian declined No - Patient declined    Current Medications (verified) Outpatient Encounter Medications as of 05/01/2021  Medication Sig   alendronate (FOSAMAX) 70 MG tablet Take 1 tablet (70 mg total) by mouth every 7 (seven) days. Take with a full glass of water on an empty stomach.   amLODipine (NORVASC) 2.5 MG tablet Take 1 tablet (2.5 mg total) by mouth daily.   aspirin EC 81 MG tablet Take 81 mg by mouth daily. Swallow whole.   lactulose (CEPHULAC) 10 g packet Take 1 packet (10 g total) by mouth at bedtime. Hold if diarrhea   levothyroxine (SYNTHROID) 25 MCG tablet Take 1 tablet (25 mcg total) by mouth daily before breakfast.   Multiple Vitamin (ONE-A-DAY ESSENTIAL PO) Take by mouth.   No facility-administered encounter medications on file as of 05/01/2021.    Allergies (verified) Patient has no known allergies.   History: History reviewed. No pertinent past medical history. History reviewed. No pertinent surgical history. History reviewed. No pertinent family history. Social History   Socioeconomic History   Marital status: Single    Spouse name: Not on file   Number of children: Not on file   Years of education: Not on file   Highest education level: Not on file  Occupational History   Not on file  Tobacco Use   Smoking status: Never   Smokeless tobacco: Never  Vaping Use   Vaping Use:  Never used  Substance and Sexual Activity   Alcohol use: Never   Drug use: Never   Sexual activity: Not Currently  Other Topics Concern   Not on file  Social History Narrative   Not on file   Social Determinants of Health   Financial Resource Strain: Low Risk    Difficulty of Paying Living Expenses: Not hard at all  Food Insecurity: No Food Insecurity   Worried About Charity fundraiser in the Last Year: Never true   [place] in the Last Year: Never true  Transportation Needs: No Transportation Needs   Lack of Transportation (Medical): No   Lack of Transportation (Non-Medical): No  Physical Activity: Sufficiently Active   Days of Exercise per Week: 5 days   Minutes of Exercise per Session: 30 min  Stress: No Stress Concern Present   Feeling of Stress : Not at all  Social Connections: Moderately Integrated   Frequency of Communication with Friends and Family: Three times a week   Frequency of Social Gatherings with Friends and Family: Three times a week   Attends Religious Services: 1 to 4 times per year   Active Member of Clubs or Organizations: Yes   Attends Archivist Meetings: More than 4 times per year   Marital Status: Never married    Tobacco Counseling Counseling given: Not Answered   Clinical Intake:  Pre-visit preparation completed: Yes  Pain : No/denies pain     Nutritional Risks: None Diabetes: No  How often do you need to have someone help you when you read instructions, pamphlets, or other written materials from your doctor or pharmacy?: 4 - Often What is the last grade level you completed in school?: [place]   Interpreter Needed?: No  Information entered by :: L.Charlet Harr,LPN   Activities of Daily Living In your present state of health, do you have any difficulty performing the following activities: 05/01/2021  Hearing? N  Vision? N  Difficulty concentrating or making decisions? N  Walking or climbing stairs? N   Dressing or bathing? N  Doing errands, shopping? N  Preparing Food and eating ? N  Using the Toilet? N  In the past six months, have you accidently leaked urine? N  Do you have problems with loss of bowel control? N  Managing your Medications? N  Managing your Finances? N  Housekeeping or managing your Housekeeping? N  Some recent data might be hidden    Patient Care Team: Nche, Charlene Brooke, NP as PCP - General (Internal Medicine)  Indicate any recent Medical Services you may have received from other than [facility] providers in the past year (date may be approximate).     Assessment:   This is a routine wellness examination for Ruth Nelson.  Hearing/Vision screen Vision Screening - Comments:: Annual eye exams wears glasses   Dietary issues and exercise activities discussed: Current Exercise Habits: Home exercise routine, Type of exercise: walking, Time (Minutes): 30, Frequency (Times/Week): 5, Weekly Exercise (Minutes/Week): 150, Intensity: Mild, Exercise limited by: None identified   Goals Addressed             This Visit's Progress    Patient Stated   On track    Increase activity & drink more water       Depression Screen PHQ 2/9 Scores 05/01/2021 05/01/2021 04/04/2020  PHQ - 2 Score 0 0 0    Fall Risk Fall Risk  05/01/2021 01/23/2021 01/23/2021 04/04/2020 01/14/2020  Falls in the past year? 0 0 0 0 0  Number falls in past yr: 0 0 0 0 0  Injury with Fall? 0 0 0 0 0  Risk for fall due to : No Fall Risks No Fall Risks No Fall Risks - -  Follow up - Falls evaluation completed Falls evaluation completed Falls prevention discussed -    FALL RISK PREVENTION PERTAINING TO THE HOME:  Any stairs in or around the home? No  If so, are there any without handrails? No  Home free of loose throw rugs in walkways, pet beds, electrical cords, etc? Yes  Adequate lighting in your home to reduce risk of falls? Yes   ASSISTIVE DEVICES UTILIZED TO PREVENT FALLS:  Life alert? No  Use of a  cane, walker or w/c? No  Grab bars in the bathroom? Yes  Shower chair or bench in shower? No  Elevated toilet seat or a handicapped toilet? No   TIMED UP AND GO:  Was the test performed? Yes .  Length of time to ambulate 10 feet: 8 sec.   Gait steady and fast without use of assistive device  Cognitive Function: Normal cognitive status assessed by direct observation by this Nurse Health Advisor. No abnormalities found.   MMSE - Mini Mental State Exam 01/14/2020  Orientation to time 0  Orientation to Place 0  Registration 3  Attention/ Calculation 0  Recall 0  Language- name 2 objects 2  Language- repeat 1  Language- follow 3 step command 3  Language- read & follow direction 1  Write a sentence 0  Copy design 0  Total score 10        Immunizations Immunization History  Administered Date(s) Administered   Fluad Quad(high Dose 65+) 01/14/2020, 12/27/2020   Moderna Sars-Covid-2 Vaccination 03/28/2019, 04/25/2019, 02/28/2020   PNEUMOCOCCAL CONJUGATE-20 12/27/2020   Pfizer Covid-19 Vaccine Bivalent Booster 87yrs & up 02/12/2021    TDAP status: Due, Education has been provided regarding the importance of this vaccine. Advised may receive this vaccine at local pharmacy or Health Dept. Aware to provide a copy of the vaccination record if obtained from local pharmacy or Health Dept. Verbalized acceptance and understanding.  Flu Vaccine status: Up to date  Pneumococcal vaccine status: Up to date  Covid-19 vaccine status: Completed vaccines  Qualifies for Shingles Vaccine? Yes   Zostavax completed No   Shingrix Completed?: No.    Education has been provided regarding the importance of this vaccine. Patient has been advised to call insurance company to determine out of pocket expense if they have not yet received this vaccine. Advised may also receive vaccine at local pharmacy or Health Dept. Verbalized acceptance and understanding.  Screening Tests Health Maintenance  Topic  Date Due   TETANUS/TDAP  Never done   Zoster Vaccines- Shingrix (1 of 2) Never done   Pneumonia Vaccine 65+ Years old  Completed   INFLUENZA VACCINE  Completed   DEXA SCAN  Completed   COVID-19 Vaccine  Completed   HPV VACCINES  Aged Out    Health Maintenance  Health Maintenance Due  Topic Date Due   TETANUS/TDAP  Never done   Zoster Vaccines- Shingrix (1 of 2) Never done    Colorectal cancer screening: No longer required.   Mammogram status: No longer required due to age.  Bone Density status: Completed 05/30/2020. Results reflect: Bone density results: OSTEOPENIA. Repeat every 5 years.  Lung Cancer Screening: (Low Dose CT Chest recommended if Age 55-80 years, 30 pack-year currently smoking OR have quit w/in 15years.) does not qualify.   Lung Cancer Screening Referral: n/a  Additional Screening:  Hepatitis C Screening: does not qualify;   Vision Screening: Recommended annual ophthalmology exams for early detection of glaucoma and other disorders of the eye. Is the patient up to date with their annual eye exam?  Yes  Who is the provider or what is the name of the office in which the patient attends annual eye exams? Dr.Omen  If pt is not established with a provider, would they like to be referred to a provider to establish care? No .   Dental Screening: Recommended annual dental exams for proper oral hygiene  Community Resource Referral / Chronic Care Management: CRR required this visit?  No   CCM required this visit?  No      Plan:     I have personally reviewed and noted the following in the patients chart:   Medical and social history Use of alcohol, tobacco or illicit drugs  Current medications and supplements including opioid prescriptions.  Functional ability and status Nutritional status Physical activity Advanced directives List of other physicians Hospitalizations, surgeries, and ER visits in previous 12 months Vitals Screenings to include  cognitive, depression, and falls Referrals and appointments  In addition, I have reviewed and discussed with patient certain preventive protocols, quality metrics, and best practice recommendations. A written personalized care plan for preventive services as  well as general preventive health recommendations were provided to patient.     Randel Pigg, LPN   0/10/1979   Nurse Notes: none

## 2021-05-03 ENCOUNTER — Telehealth: Payer: Medicare HMO | Admitting: Nurse Practitioner

## 2021-05-23 ENCOUNTER — Other Ambulatory Visit: Payer: Self-pay | Admitting: Nurse Practitioner

## 2021-05-23 DIAGNOSIS — Z1231 Encounter for screening mammogram for malignant neoplasm of breast: Secondary | ICD-10-CM

## 2021-05-28 ENCOUNTER — Ambulatory Visit
Admission: RE | Admit: 2021-05-28 | Discharge: 2021-05-28 | Disposition: A | Discharge status: Home / Self Care | Payer: Medicare HMO | Source: Ambulatory Visit | Attending: Nurse Practitioner | Admitting: Nurse Practitioner

## 2021-05-28 DIAGNOSIS — Z1231 Encounter for screening mammogram for malignant neoplasm of breast: Secondary | ICD-10-CM

## 2021-07-24 ENCOUNTER — Other Ambulatory Visit: Payer: Self-pay | Admitting: Family

## 2021-08-22 ENCOUNTER — Other Ambulatory Visit: Payer: Self-pay | Admitting: Nurse Practitioner

## 2021-08-22 DIAGNOSIS — K5900 Constipation, unspecified: Secondary | ICD-10-CM

## 2021-08-23 NOTE — Telephone Encounter (Signed)
Chart supports Rx Last OV: 02/2021 Next Ov: 04/2022

## 2021-09-06 ENCOUNTER — Other Ambulatory Visit: Payer: Self-pay | Admitting: Family

## 2021-09-25 ENCOUNTER — Encounter: Payer: Self-pay | Admitting: Nurse Practitioner

## 2021-09-25 ENCOUNTER — Ambulatory Visit (INDEPENDENT_AMBULATORY_CARE_PROVIDER_SITE_OTHER): Payer: Medicare HMO | Admitting: Nurse Practitioner

## 2021-09-25 VITALS — BP 112/52 | HR 94 | Temp 97.4°F | Ht 62.0 in | Wt 111.0 lb

## 2021-09-25 DIAGNOSIS — I10 Essential (primary) hypertension: Secondary | ICD-10-CM | POA: Diagnosis not present

## 2021-09-25 DIAGNOSIS — R4689 Other symptoms and signs involving appearance and behavior: Secondary | ICD-10-CM | POA: Insufficient documentation

## 2021-09-25 DIAGNOSIS — N1832 Chronic kidney disease, stage 3b: Secondary | ICD-10-CM | POA: Diagnosis not present

## 2021-09-25 DIAGNOSIS — F819 Developmental disorder of scholastic skills, unspecified: Secondary | ICD-10-CM | POA: Insufficient documentation

## 2021-09-25 DIAGNOSIS — E038 Other specified hypothyroidism: Secondary | ICD-10-CM | POA: Diagnosis not present

## 2021-09-25 DIAGNOSIS — M810 Age-related osteoporosis without current pathological fracture: Secondary | ICD-10-CM | POA: Diagnosis not present

## 2021-09-25 LAB — COMPREHENSIVE METABOLIC PANEL
ALT: 14 U/L (ref 0–35)
AST: 19 U/L (ref 0–37)
Albumin: 3.9 g/dL (ref 3.5–5.2)
Alkaline Phosphatase: 51 U/L (ref 39–117)
BUN: 15 mg/dL (ref 6–23)
CO2: 28 mEq/L (ref 19–32)
Calcium: 9.5 mg/dL (ref 8.4–10.5)
Chloride: 105 mEq/L (ref 96–112)
Creatinine, Ser: 1.63 mg/dL — ABNORMAL HIGH (ref 0.40–1.20)
GFR: 29.34 mL/min — ABNORMAL LOW (ref >60.00)
Glucose, Bld: 81 mg/dL (ref 70–99)
Potassium: 5.3 mEq/L — ABNORMAL HIGH (ref 3.5–5.1)
Sodium: 140 mEq/L (ref 135–145)
Total Bilirubin: 0.3 mg/dL (ref 0.2–1.2)
Total Protein: 7.7 g/dL (ref 6.0–8.3)

## 2021-09-25 LAB — POCT URINALYSIS DIPSTICK
Bilirubin, UA: NEGATIVE
Blood, UA: NEGATIVE
Glucose, UA: NEGATIVE
Leukocytes, UA: NEGATIVE
Nitrite, UA: NEGATIVE
Protein, UA: POSITIVE — AB
Spec Grav, UA: 1.02 (ref 1.010–1.025)
Urobilinogen, UA: 0.2 E.U./dL
pH, UA: 6 (ref 5.0–8.0)

## 2021-09-25 LAB — CBC
HCT: 45.1 % (ref 36.0–46.0)
Hemoglobin: 14.4 g/dL (ref 12.0–15.0)
MCHC: 31.9 g/dL (ref 30.0–36.0)
MCV: 91.8 fl (ref 78.0–100.0)
Platelets: 299 10*3/uL (ref 150.0–400.0)
RBC: 4.92 Mil/uL (ref 3.87–5.11)
RDW: 12.7 % (ref 11.5–15.5)
WBC: 4.4 10*3/uL (ref 4.0–10.5)

## 2021-09-25 LAB — T4, FREE: Free T4: 1.01 ng/dL (ref 0.60–1.60)

## 2021-09-25 LAB — TSH: TSH: 5.12 u[IU]/mL (ref 0.35–5.50)

## 2021-09-25 MED ORDER — LEVOTHYROXINE SODIUM 25 MCG PO TABS: 25.0000 ug | ORAL_TABLET | Freq: Every day | ORAL | 1 refills | Status: DC

## 2021-09-25 MED ORDER — CITALOPRAM HYDROBROMIDE 10 MG PO TABS: 10.0000 mg | ORAL_TABLET | Freq: Every day | ORAL | 5 refills | Status: DC
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
## 2021-09-25 NOTE — Assessment & Plan Note (Signed)
Repeat CMP Continue calcium and vit. D supplement Continue fosamax weekly

## 2021-09-25 NOTE — Patient Instructions (Addendum)
Hold amlodipine Monitor BP daily in Am Give amlodipine 2.5mg  only if BP>140/80.  Start citalopram 10mg  daily Let me know if I need to complete a new form for day care center.  Go to lab

## 2021-09-25 NOTE — Assessment & Plan Note (Signed)
Low BP noted today. No syncope, no dizziness BP Readings from Last 3 Encounters:  09/25/21 (!) 112/52  05/01/21 138/64  03/15/21 138/70   Hold amlodipine Monitor BP daily in Am Give amlodipine 2.5mg  only if BP>140/80. F/up in 80month

## 2021-09-26 ENCOUNTER — Encounter: Payer: Self-pay | Admitting: Nurse Practitioner

## 2021-09-26 NOTE — Assessment & Plan Note (Signed)
Repeat TSh and T4: stable  maintain med dose

## 2021-09-26 NOTE — Assessment & Plan Note (Signed)
Repeat BMP: stable

## 2021-10-08 ENCOUNTER — Ambulatory Visit: Payer: Medicare HMO | Admitting: Nurse Practitioner

## 2021-10-09 ENCOUNTER — Telehealth: Payer: Self-pay | Admitting: Nurse Practitioner

## 2021-10-09 NOTE — Telephone Encounter (Signed)
Type of forms received:adult daycare paperwork  Routed ID:[id number]  Paperwork received by : terrill    Individual made aware of 3-5 business day turn around (Y/N):y  Form completed and patient made aware of charges(Y/N):y   Faxed to :   Form location:  nche box

## 2021-10-09 NOTE — Telephone Encounter (Signed)
Forms received, please allow 7-10 business days to complete  

## 2021-10-17 ENCOUNTER — Other Ambulatory Visit: Payer: Self-pay | Admitting: Nurse Practitioner

## 2021-10-17 DIAGNOSIS — R4689 Other symptoms and signs involving appearance and behavior: Secondary | ICD-10-CM

## 2021-10-17 NOTE — Telephone Encounter (Signed)
Chart supports Rx Last OV: 09/2021 Next OV: 09/2021  

## 2021-10-19 NOTE — Telephone Encounter (Signed)
Please refax, missing pages 2 and 4. The back has to be faxed also.

## 2021-10-19 NOTE — Telephone Encounter (Signed)
Forms have been faxed over again.

## 2021-10-19 NOTE — Telephone Encounter (Signed)
Ruth Nelson from [facility] is needing pt's paperwork faxed back over. Two of the pages did not come through. Fax [phone]. If any further questions please give Ruth Nelson a call @ [phone].

## 2021-10-22 NOTE — Telephone Encounter (Signed)
done

## 2021-10-23 ENCOUNTER — Ambulatory Visit (INDEPENDENT_AMBULATORY_CARE_PROVIDER_SITE_OTHER): Payer: Medicare HMO

## 2021-10-23 DIAGNOSIS — Z23 Encounter for immunization: Secondary | ICD-10-CM | POA: Diagnosis not present

## 2021-10-23 NOTE — Progress Notes (Signed)
Per orders of Wilfred Lacy, NP, injection of Influenza given by Armandina Gemma, cma.  Patient tolerated injection well.

## 2021-11-01 ENCOUNTER — Telehealth: Payer: Self-pay | Admitting: *Deleted

## 2021-11-01 NOTE — Chronic Care Management (AMB) (Signed)
  Chronic Care Management   Note  11/01/2021 Name: MIRACLE MONGILLO MRN: [id number] DOB: 05/04/1940  [name] is a 81 y.o. year old female who is a primary care patient of Nche, Charlene Brooke, NP. I reached out to [name] by phone today in response to a referral sent by Ms. Daiva Nakayama Lemberger's PCP.  Ms. Moccio was given information about Chronic Care Management services today including:  CCM service includes personalized support from designated clinical staff supervised by her physician, including individualized plan of care and coordination with other care providers 24/7 contact phone numbers for assistance for urgent and routine care needs. Service will only be billed when office clinical staff spend 20 minutes or more in a month to coordinate care. Only one practitioner may furnish and bill the service in a calendar month. The patient may stop CCM services at any time (effective at the end of the month) by phone call to the office staff. The patient is responsible for co-pay (up to 20% after annual deductible is met) if co-pay is required by the individual health plan.   Patient agreed to services and verbal consent obtained.   Follow up plan: Telephone appointment with care management team member scheduled for:11/22/21  [place]: [phone]

## 2021-11-01 NOTE — Chronic Care Management (AMB) (Signed)
  Care Coordination  Outreach Note  11/01/2021 Name: MIKALYN HERMIDA MRN: [id number] DOB: 07/04/1940   Care Coordination Outreach Attempts: An unsuccessful telephone outreach was attempted today to offer the patient information about available care coordination services as a benefit of their health plan.   Follow Up Plan:  Additional outreach attempts will be made to offer the patient care coordination information and services.   Encounter Outcome:  No Answer  [place]  Direct Dial: [phone]

## 2021-11-01 NOTE — Chronic Care Management (AMB) (Signed)
  Care Coordination   Note   11/01/2021 Name: Ruth Nelson MRN: [id number] DOB: 1940-05-15  [name] is a 81 y.o. year old female who sees Nche, Charlene Brooke, NP for primary care. I reached out to [name] by phone today to offer care coordination services.  Ms. Barbe was given information about Care Coordination services today including:   The Care Coordination services include support from the care team which includes your Nurse Coordinator, Clinical Social Worker, or Pharmacist.  The Care Coordination team is here to help remove barriers to the health concerns and goals most important to you. Care Coordination services are voluntary, and the patient may decline or stop services at any time by request to their care team member.   Care Coordination Consent Status: Patient sister Phoebe Perch [facility] on file agreed to services and verbal consent obtained.   Follow up plan:  Telephone appointment with care coordination team member scheduled for:  11/02/21  Encounter Outcome:  Pt. Scheduled  [place]  Direct Dial: [phone]

## 2021-11-02 ENCOUNTER — Ambulatory Visit: Payer: Self-pay | Admitting: *Deleted

## 2021-11-02 NOTE — Patient Instructions (Signed)
Visit Information  Thank you for taking time to visit with me today. Please don't hesitate to contact me if I can be of assistance to you.   Following are the goals we discussed today:   Goals Addressed             This Visit's Progress    Support for sister/caregiver to reduce stress       Care Coordination Interventions:  Pt's sister is primary caregiver and pt has lived with her and her husband for @20  years Sister does not have 24 and CSW discussed Guardianship as a suggestion Sister has secured a day program in [place] where pt goes- transportation is provided however they have had some struggles with pickup time, etc Solution-Focused Strategies employed:  Active listening / Reflection utilized  Emotional Support Provided Problem [place] strategies reviewed Provided psychoeducation for mental health needs  Caregiver stress acknowledged  Participation in counseling encouraged for sister due to caregiver stress/worry- pt to call her insurance to navigate options for in -network providers Sister to seek legal guidance for considering Guardianship of pt         Our next appointment is by telephone on 11/27/21   Please call the care guide team at [phone] if you need to cancel or reschedule your appointment.   If you are experiencing a Mental Health or [place] or need someone to talk to, please call the [place] National Suicide Prevention Lifeline: 408-881-8910 or TTY: (706) 102-6049 TTY (862)697-8996) to talk to a trained counselor call 911   The patient verbalized understanding of instructions, educational materials, and care plan provided today and DECLINED offer to receive copy of patient instructions, educational materials, and care plan.   Telephone follow up appointment with care management team member scheduled for:11/27/21  Eduard Clos MSW, LCSW Licensed Clinical Social Worker      [phone]

## 2021-11-02 NOTE — Patient Outreach (Signed)
  Care Coordination   Initial Visit Note   11/02/2021 Name: Ruth Nelson MRN: [id number] DOB: Sep 19, 1940  [name] is a 81 y.o. year old female who sees Nche, Charlene Brooke, NP for primary care. I spoke with  [name], sister and caregiver of Ruth Nelson by phone today.  What matters to the patients health and wellness today?  Overall support/balance for pt and sister in caregiving, planning, etc.    Goals Addressed             This Visit's Progress    Support for sister/caregiver to reduce stress       Care Coordination Interventions:  Pt's sister is primary caregiver and pt has lived with her and her husband for @20  years Sister does not have 32 and CSW discussed Guardianship as a suggestion Sister has secured a day program in [place] where pt goes- transportation is provided however they have had some struggles with pickup time, etc Solution-Focused Strategies employed:  Active listening / Reflection utilized  Emotional Support Provided Problem [place] strategies reviewed Provided psychoeducation for mental health needs  Caregiver stress acknowledged  Participation in counseling encouraged for sister due to caregiver stress/worry- pt to call her insurance to navigate options for in -network providers Sister to seek legal guidance for considering Guardianship of pt         SDOH assessments and interventions completed:  Yes  SDOH Interventions Today    Flowsheet Row Most Recent Value  SDOH Interventions   Food Insecurity Interventions Intervention Not Indicated  Housing Interventions Intervention Not Indicated  Transportation Interventions Intervention Not Indicated  Financial Strain Interventions Intervention Not Indicated  Social Connections Interventions Other (Comment)  [Pt attends a day program in [place] Coordination Interventions Activated:  Yes  Care Coordination Interventions:  Yes, provided   Follow up  plan: Follow up call scheduled for 11/27/21    Encounter Outcome:  Pt. Visit Completed   Eduard Clos MSW, LCSW Licensed Clinical Social Worker      [phone]

## 2021-11-16 ENCOUNTER — Telehealth: Payer: Self-pay

## 2021-11-16 NOTE — Progress Notes (Signed)
    Chronic Care Management Pharmacy Assistant   Name: Ruth Nelson  MRN: [id number] DOB: [birth year]  Chart review for the clinical pharmacist for 11/22/2021 at 9:15 am,  Conditions to be addressed/monitored: HTN, CKD Stage 3, Hypothyroidism, and Osteoporosis  Primary concerns for visit include: None ID   Recent office visits:  11/02/2021 Eduard Clos LCSW (CCM) No medication Changes noted 09/25/2021 Wilfred Lacy NP (PCP) Start citalopram 10mg  daily, Hold amlodipine - Give amlodipine 2.5mg  only if BP>140/80, AMB Referral to [place] in about 4 weeks   Recent consult visits:  No recent consult visit  Hospital visits:  None in previous 6 months  Questions for Clinical Pharmacist:   1.Are you able to connect with Patient yes     2.Confirmed appointment date/time with patient/caregiver?  Patient caregiver hung up the phone after I inform her why I was calling.      3.Visit type telephone   I attempted to reach Caregiver and ask questions for the clinical pharmacist after I inform her why I was calling patient caregiver hung up.   Medications: Outpatient Encounter Medications as of 11/16/2021  Medication Sig   alendronate (FOSAMAX) 70 MG tablet Take 1 tablet (70 mg total) by mouth every 7 (seven) days. Take with a full glass of water on an empty stomach.   amLODipine (NORVASC) 2.5 MG tablet Take 1 tablet (2.5 mg total) by mouth daily.   aspirin EC 81 MG tablet Take 81 mg by mouth daily. Swallow whole.   citalopram (CELEXA) 10 MG tablet TAKE 1 TABLET BY MOUTH EVERY DAY   lactulose (KRISTALOSE) 10 g packet TAKE 1 PACKET (10 G TOTAL) BY MOUTH AT BEDTIME. HOLD IF DIARRHEA   levothyroxine (SYNTHROID) 25 MCG tablet Take 1 tablet (25 mcg total) by mouth daily before breakfast.   Multiple Vitamin (ONE-A-DAY ESSENTIAL PO) Take by mouth.   No facility-administered encounter medications on file as of 11/16/2021.    Care Gaps: COVID-19  Vaccine  Star Rating Drugs: None ID   [place] Clinical Pharmacist Assistant [phone]

## 2021-11-22 ENCOUNTER — Ambulatory Visit (INDEPENDENT_AMBULATORY_CARE_PROVIDER_SITE_OTHER): Payer: Medicare HMO

## 2021-11-22 DIAGNOSIS — N1832 Chronic kidney disease, stage 3b: Secondary | ICD-10-CM

## 2021-11-22 DIAGNOSIS — I10 Essential (primary) hypertension: Secondary | ICD-10-CM

## 2021-11-22 DIAGNOSIS — M810 Age-related osteoporosis without current pathological fracture: Secondary | ICD-10-CM

## 2021-11-22 DIAGNOSIS — E038 Other specified hypothyroidism: Secondary | ICD-10-CM

## 2021-11-22 NOTE — Progress Notes (Signed)
Chronic Care Management Pharmacy Note  11/22/2021 Name:  Ruth Nelson MRN:  [id number] DOB:  02-Mar-1940  Summary: Patient presents for initial CCM consult. Today's visit was 100% conducted with [name], the patient's sister and caregiver.   -No improvement noted since starting citalopram. Does note less outbursts at her new Daycare, but it is unclear if that is due to closer supervision by the staff or the medication.   -Patient's sister states she was instructed by PCP to stop alendronate, although she is unsure why. Given CKD 3b, would not recommend bisphosphonate in this patient.   Recommendations/Changes made from today's visit: -Recommend initiation of Prolia 60 mg every 6 months.  -Recommend increasing citalopram to 20 mg daily   Plan: CPP follow-up 3 months  Subjective: Ruth Nelson is an 81 y.o. year old female who is a primary patient of Nche, Charlene Brooke, NP.  The CCM team was consulted for assistance with disease management and care coordination needs.    Engaged with patient by telephone for initial visit in response to provider referral for pharmacy case management and/or care coordination services.   Consent to Services:  The patient was given the following information about Chronic Care Management services today, agreed to services, and gave verbal consent: 1. CCM service includes personalized support from designated clinical staff supervised by the primary care provider, including individualized plan of care and coordination with other care providers 2. 24/7 contact phone numbers for assistance for urgent and routine care needs. 3. Service will only be billed when office clinical staff spend 20 minutes or more in a month to coordinate care. 4. Only one practitioner may furnish and bill the service in a calendar month. 5.The patient may stop CCM services at any time (effective at the end of the month) by phone call to the office staff. 6. The patient will be  responsible for cost sharing (co-pay) of up to 20% of the service fee (after annual deductible is met). Patient agreed to services and consent obtained.  Patient Care Team: Nche, Charlene Brooke, NP as PCP - General (Internal Medicine) Deirdre Peer, LCSW as Social Worker (Licensed Clinical Social Worker) Germaine Pomfret, [facility] (Pharmacist)  Recent office visits: 11/02/2021 Eduard Clos LCSW (CCM) No medication Changes noted 09/25/2021 Wilfred Lacy NP (PCP) Start citalopram 6m daily, Hold amlodipine - Give amlodipine 2.549monly if BP>140/80, AMB Referral to [place] about 4 weeks   Recent consult visits: None in previous 6 months  Hospital visits: None in previous 6 months   Objective:  Lab Results  Component Value Date   CREATININE 1.63 (H) 09/25/2021   BUN 15 09/25/2021   GFR 29.34 (L) 09/25/2021   NA 140 09/25/2021   K 5.3 (H) 09/25/2021   CALCIUM 9.5 09/25/2021   CO2 28 09/25/2021   GLUCOSE 81 09/25/2021    Lab Results  Component Value Date/Time   GFR 29.34 (L) 09/25/2021 10:41 AM   GFR 49.40 (L) 01/23/2021 12:09 PM    Last diabetic Eye exam: No results found for: "HMDIABEYEEXA"  Last diabetic Foot exam: No results found for: "HMDIABFOOTEX"   Lab Results  Component Value Date   CHOL 199 01/23/2021   HDL 73.10 01/23/2021   LDLCALC 110 (H) 01/23/2021   TRIG 78.0 01/23/2021   CHOLHDL 3 01/23/2021       Latest Ref Rng & Units 09/25/2021   10:41 AM 01/23/2021   12:09 PM 01/18/2020   10:18 AM  Hepatic Function  Total Protein 6.0 - 8.3 g/dL 7.7  7.2  7.4   Albumin 3.5 - 5.2 g/dL 3.9  4.3  4.0   AST 0 - 37 U/L '19  19  22   ' ALT 0 - 35 U/L '14  4  15   ' Alk Phosphatase 39 - 117 U/L 51  52  73   Total Bilirubin 0.2 - 1.2 mg/dL 0.3  0.6  0.4     Lab Results  Component Value Date/Time   TSH 5.12 09/25/2021 10:41 AM   TSH 1.81 01/23/2021 12:09 PM   FREET4 1.01 09/25/2021 10:41 AM   FREET4 0.87 01/23/2021 12:09 PM        Latest Ref Rng & Units 09/25/2021   10:41 AM 01/18/2020   10:18 AM  CBC  WBC 4.0 - 10.5 K/uL 4.4  6.7   Hemoglobin 12.0 - 15.0 g/dL 14.4  13.5   Hematocrit 36.0 - 46.0 % 45.1  41.5   Platelets 150.0 - 400.0 K/uL 299.0  270.0     No results found for: "VD25OH"  Clinical ASCVD: No  The ASCVD Risk score (Arnett DK, et al., 2019) failed to calculate for the following reasons:   The 2019 ASCVD risk score is only valid for ages 82 to 51       05/01/2021    3:06 PM 05/01/2021    2:59 PM 04/04/2020   10:14 AM  Depression screen PHQ 2/9  Decreased Interest 0 0 0  Down, Depressed, Hopeless 0 0 0  PHQ - 2 Score 0 0 0    -Last DEXA Scan: 09/04/20   T-Score femoral neck: -3.4  T-Score total hip: -3.4  T-Score lumbar spine: -3.7  Social History   Tobacco Use  Smoking Status Never  Smokeless Tobacco Never   BP Readings from Last 3 Encounters:  09/25/21 (!) 112/52  05/01/21 138/64  03/15/21 138/70   Pulse Readings from Last 3 Encounters:  09/25/21 94  05/01/21 81  03/15/21 60   Wt Readings from Last 3 Encounters:  09/25/21 111 lb (50.3 kg)  05/01/21 113 lb (51.3 kg)  03/15/21 113 lb (51.3 kg)   BMI Readings from Last 3 Encounters:  09/25/21 20.30 kg/m  05/01/21 20.67 kg/m  03/15/21 21.01 kg/m    Assessment/Interventions: Review of patient past medical history, allergies, medications, health status, including review of consultants reports, laboratory and other test data, was performed as part of comprehensive evaluation and provision of chronic care management services.   SDOH:  (Social Determinants of Health) assessments and interventions performed: Yes SDOH Interventions    Flowsheet Row Chronic Care Management from 11/22/2021 in [place] from 11/02/2021 in [place] from 05/01/2021 in [facility] Interventions --  Intervention Not Indicated Intervention Not Indicated  Housing Interventions -- Intervention Not Indicated Intervention Not Indicated  Transportation Interventions Intervention Not Indicated Intervention Not Indicated Intervention Not Indicated  Financial Strain Interventions Intervention Not Indicated Intervention Not Indicated Intervention Not Indicated  Physical Activity Interventions -- -- Intervention Not Indicated  Stress Interventions -- -- Intervention Not Indicated  Social Connections Interventions -- Other (Comment)  [Pt attends a day program in [place]] Intervention Not Indicated      [place]: No Food Insecurity (11/02/2021)  Housing: Low Risk  (11/02/2021)  Transportation Needs: No Transportation Needs (11/22/2021)  Alcohol Screen: Low Risk  (05/01/2021)  Depression (PHQ2-9): Low Risk  (05/01/2021)  Financial Resource Strain: Low Risk  (11/22/2021)  Physical Activity: Sufficiently Active (05/01/2021)  Social Connections: Moderately Integrated (11/02/2021)  Stress: No Stress Concern Present (05/01/2021)  Tobacco Use: Low Risk  (09/26/2021)    CCM Care Plan  No Known Allergies  Medications Reviewed Today     Reviewed by Flossie Buffy, NP (Nurse Practitioner) on 09/26/21 at 1526  Med List Status: <None>   Medication Order Taking? Sig Documenting Provider Last Dose Status Informant  alendronate (FOSAMAX) 70 MG tablet [id number] Yes Take 1 tablet (70 mg total) by mouth every 7 (seven) days. Take with a full glass of water on an empty stomach. Dutch Quint B, FNP Taking Active   amLODipine (NORVASC) 2.5 MG tablet [id number] Yes Take 1 tablet (2.5 mg total) by mouth daily. Flossie Buffy, NP Taking Active   aspirin EC 81 MG tablet [id number] Yes Take 81 mg by mouth daily. Swallow whole. [provider] Taking Active   citalopram (CELEXA) 10 MG tablet [id number] Yes Take 1 tablet (10 mg total) by mouth daily. Nche, Charlene Brooke, NP  Active    lactulose (KRISTALOSE) 10 g packet [id number] Yes TAKE 1 PACKET (10 G TOTAL) BY MOUTH AT BEDTIME. HOLD IF DIARRHEA Nche, Charlene Brooke, NP Taking Active   levothyroxine (SYNTHROID) 25 MCG tablet [id number]  Take 1 tablet (25 mcg total) by mouth daily before breakfast. Nche, Charlene Brooke, NP  Active   Multiple Vitamin (ONE-A-DAY ESSENTIAL PO) [id number] Yes Take by mouth. [provider] Taking Active             Patient Active Problem List   Diagnosis Date Noted   Aggressive behavior of adult 09/25/2021   Age related osteoporosis 01/23/2021   HTN (hypertension), benign 05/02/2020   Other specified hypothyroidism 05/02/2020   Stage 3b chronic kidney disease ([place]) 05/02/2020    Immunization History  Administered Date(s) Administered   Fluad Quad(high Dose 65+) 01/14/2020, 12/27/2020   Influenza,inj,Quad PF,6+ Mos 10/23/2021   Moderna Sars-Covid-2 Vaccination 03/28/2019, 04/25/2019, 02/28/2020   PNEUMOCOCCAL CONJUGATE-20 12/27/2020   Pfizer Covid-19 Vaccine Bivalent Booster 8yr & up 02/12/2021    Conditions to be addressed/monitored:  Hypertension, Chronic Kidney Disease, Hypothyroidism, Osteoporosis, and mental retardation  Care Plan : General Pharmacy (Adult)  Updates made by FGermaine Pomfret RPH since 11/22/2021 12:00 AM     Problem: Hypertension, Chronic Kidney Disease, Hypothyroidism, Osteoporosis, and mental retardation   Priority: High     Long-Range Goal: Patient-Specific Goal   Start Date: 11/22/2021  Expected End Date: 11/23/2022  This Visit's Progress: On track  Priority: High  Note:   Current Barriers:  Suboptimal therapeutic regimen for osteoporosis, mental retardation  Pharmacist Clinical Goal(s):  Patient will adhere to plan to optimize therapeutic regimen for osteoporosis and mental retardation as evidenced by report of adherence to recommended medication management changes through collaboration with PharmD and provider.    Interventions: 1:1 collaboration with Nche, CCharlene Brooke NP regarding development and update of comprehensive plan of care as evidenced by provider attestation and co-signature Inter-disciplinary care team collaboration (see longitudinal plan of care) Comprehensive medication review performed; medication list updated in electronic medical record  Hypertension (BP goal <140/90) -Controlled -Current treatment: Amlodipine 2.5 mg daily only if BP is over 140/90 -Medications previously tried: NA   -Denies hypotensive/hypertensive symptoms -Recommended to continue current medication  Mental Retardation (Goal: Minimize outbursts and maintain quality of life) -Uncontrolled -Current treatment: Citalopram 10 mg daily  -Medications previously tried/failed:  NA -No improvement noted since starting citalopram. Does note less outbursts at her new Daycare, but it is unclear if that is due to closer supervision by the staff or the medication.  -Recommend increasing citalopram to 20 mg daily   Osteoporosis (Goal Prevent bone fractures) -Uncontrolled -Patient is a candidate for pharmacologic treatment due to T-Score < -2.5 in femoral neck, T-Score < -2.5 in total hip , and T-Score < -2.5 in lumbar spine -Current treatment  Multivitamin daily  -Medications previously tried: Alendronate -Patient's sister states she was instructed by PCP to stop alendronate, although she is unsure why. Given CKD 3b, would not recommend bisphosphonate in this patient.  -Recommend initiation of Prolia 60 mg every 6 months.   Hypothyroidism (Goal: Maintain thyroid function) -Controlled -Current treatment  Levothyroxine 25 mcg daily  -Medications previously tried: NA  -Recommended to continue current medication  Constipation (Goal: Improve symptoms) -Controlled -Current treatment  Lactulose twice weekly  -Medications previously tried: NA -Patient is not able to communicate when she is having symptoms of  constipation. Sister has been giving her lactulose approximately 2-3 times throughout the week.   -Recommended to continue current medication  Chronic Kidney Disease Stage 3b  -All medications assessed for renal dosing and appropriateness in chronic kidney disease. -Recommended to continue current medication  Patient Goals/Self-Care Activities Patient will:  - check blood pressure every other day, document, and provide at future appointments  Follow Up Plan: Telephone follow up appointment with care management team member scheduled for:  02/21/22 at 11:00 AM      Medication Assistance: None required.  Patient affirms current coverage meets needs.  Compliance/Adherence/Medication fill history: Care Gaps: COVID-19 Vaccine  Star-Rating Drugs: None ID  Patient's preferred pharmacy is:  [id number] [place], [place] - [id number]: [phone]: [phone] Uses pill box? Yes Pt endorses 100% compliance  We discussed: Current pharmacy is preferred with insurance plan and patient is satisfied with pharmacy services Patient decided to: Continue current medication management strategy  Care Plan and Follow Up Patient Decision:  Patient agrees to Care Plan and Follow-up.  Plan: Telephone follow up appointment with care management team member scheduled for:  02/21/22 at 11:00 AM  AJunius Argyle PharmD, BPara March CPP Clinical Pharmacist Practitioner  [facility] [phone]

## 2021-11-22 NOTE — Patient Instructions (Signed)
Visit Information It was great speaking with you today!  Please let me know if you have any questions about our visit.  Patient Care Plan: General Pharmacy (Adult)     Problem Identified: Hypertension, Chronic Kidney Disease, Hypothyroidism, Osteoporosis, and mental retardation   Priority: High     Long-Range Goal: Patient-Specific Goal   Start Date: 11/22/2021  Expected End Date: 11/23/2022  This Visit's Progress: On track  Priority: High  Note:   Current Barriers:  Suboptimal therapeutic regimen for osteoporosis, mental retardation  Pharmacist Clinical Goal(s):  Patient will adhere to plan to optimize therapeutic regimen for osteoporosis and mental retardation as evidenced by report of adherence to recommended medication management changes through collaboration with PharmD and provider.   Interventions: 1:1 collaboration with Nche, Charlene Brooke, NP regarding development and update of comprehensive plan of care as evidenced by provider attestation and co-signature Inter-disciplinary care team collaboration (see longitudinal plan of care) Comprehensive medication review performed; medication list updated in electronic medical record  Hypertension (BP goal <140/90) -Controlled -Current treatment: Amlodipine 2.5 mg daily only if BP is over 140/90 -Medications previously tried: NA   -Denies hypotensive/hypertensive symptoms -Recommended to continue current medication  Mental Retardation (Goal: Minimize outbursts and maintain quality of life) -Uncontrolled -Current treatment: Citalopram 10 mg daily  -Medications previously tried/failed: NA -No improvement noted since starting citalopram. Does note less outbursts at her new Daycare, but it is unclear if that is due to closer supervision by the staff or the medication.  -Recommend increasing citalopram to 20 mg daily   Osteoporosis (Goal Prevent bone fractures) -Uncontrolled -Patient is a candidate for pharmacologic treatment  due to T-Score < -2.5 in femoral neck, T-Score < -2.5 in total hip , and T-Score < -2.5 in lumbar spine -Current treatment  Multivitamin daily  -Medications previously tried: Alendronate -Patient's sister states she was instructed by PCP to stop alendronate, although she is unsure why. Given CKD 3b, would not recommend bisphosphonate in this patient.  -Recommend initiation of Prolia 60 mg every 6 months.   Hypothyroidism (Goal: Maintain thyroid function) -Controlled -Current treatment  Levothyroxine 25 mcg daily  -Medications previously tried: NA  -Recommended to continue current medication  Constipation (Goal: Improve symptoms) -Controlled -Current treatment  Lactulose twice weekly  -Medications previously tried: NA -Patient is not able to communicate when she is having symptoms of constipation. Sister has been giving her lactulose approximately 2-3 times throughout the week.   -Recommended to continue current medication  Chronic Kidney Disease Stage 3b  -All medications assessed for renal dosing and appropriateness in chronic kidney disease. -Recommended to continue current medication  Patient Goals/Self-Care Activities Patient will:  - check blood pressure every other day, document, and provide at future appointments  Follow Up Plan: Telephone follow up appointment with care management team member scheduled for:  02/21/22 at 11:00 AM    Ms. Wyss was given information about Chronic Care Management services today including:  CCM service includes personalized support from designated clinical staff supervised by her physician, including individualized plan of care and coordination with other care providers 24/7 contact phone numbers for assistance for urgent and routine care needs. Standard insurance, coinsurance, copays and deductibles apply for chronic care management only during months in which we provide at least 20 minutes of these services. Most insurances cover these  services at 100%, however patients may be responsible for any copay, coinsurance and/or deductible if applicable. This service may help you avoid the need for more expensive face-to-face services. Only  one practitioner may furnish and bill the service in a calendar month. The patient may stop CCM services at any time (effective at the end of the month) by phone call to the office staff.  Patient agreed to services and verbal consent obtained.   The patient verbalized understanding of instructions, educational materials, and care plan provided today and DECLINED offer to receive copy of patient instructions, educational materials, and care plan.   Junius Argyle, PharmD, Para March, CPP Clinical Pharmacist Practitioner  [facility]  [phone]

## 2021-11-23 DIAGNOSIS — H40013 Open angle with borderline findings, low risk, bilateral: Secondary | ICD-10-CM | POA: Diagnosis not present

## 2021-11-24 DIAGNOSIS — I129 Hypertensive chronic kidney disease with stage 1 through stage 4 chronic kidney disease, or unspecified chronic kidney disease: Secondary | ICD-10-CM

## 2021-11-24 DIAGNOSIS — N1832 Chronic kidney disease, stage 3b: Secondary | ICD-10-CM | POA: Diagnosis not present

## 2021-11-24 DIAGNOSIS — M810 Age-related osteoporosis without current pathological fracture: Secondary | ICD-10-CM | POA: Diagnosis not present

## 2021-11-24 DIAGNOSIS — E039 Hypothyroidism, unspecified: Secondary | ICD-10-CM

## 2021-11-26 DIAGNOSIS — Z0100 Encounter for examination of eyes and vision without abnormal findings: Secondary | ICD-10-CM | POA: Diagnosis not present

## 2021-11-27 ENCOUNTER — Ambulatory Visit: Payer: Self-pay | Admitting: *Deleted

## 2021-11-27 NOTE — Patient Instructions (Signed)
Visit Information  Thank you for taking time to visit with me today. Please don't hesitate to contact me if I can be of assistance to you.   Following are the goals we discussed today:   Goals Addressed             This Visit's Progress    COMPLETED: Support for sister/caregiver to reduce stress       Care Coordination Interventions:  Pt's sister is primary caregiver and pt has lived with her and her husband for @20  years Sister does not have 78 and CSW discussed Guardianship as a suggestion Sister has secured a day program in [place] where pt goes- transportation is provided however they have had some struggles with pickup time, etc Solution-Focused Strategies employed:  Active listening / Reflection utilized  Emotional Support Provided Problem [place] strategies reviewed Provided psychoeducation for mental health needs  Caregiver stress acknowledged  Participation in counseling encouraged for sister due to caregiver stress/worry- pt to call her insurance to navigate options for in -network providers Sister to seek legal guidance for considering Guardianship of pt         O   Please call the care guide team at [phone] if you need follow up support.  If you are experiencing a Mental Health or [place] or need someone to talk to, please call the Suicide and Crisis Lifeline: 988 call 1-800-273-TALK (toll free, 24 hour hotline) call 911   The patient verbalized understanding of instructions, educational materials, and care plan provided today and DECLINED offer to receive copy of patient instructions, educational materials, and care plan.   No further follow up required:    Eduard Clos MSW, LCSW Licensed Clinical Social Worker      [phone]

## 2021-11-27 NOTE — Patient Outreach (Signed)
  Care Coordination   Follow Up Visit Note   11/27/2021 Name: ZANDRA LAJEUNESSE MRN: [id number] DOB: 08/05/1940  [name] is a 81 y.o. year old female who sees Nche, Charlene Brooke, NP for primary care. I spoke with [name], sister of LAURELAI LEPP by phone today.  What matters to the patients health and wellness today? Per sister, pt is attending Adult Day Program called [facility] in [place]. She is picked up at 7am and goes 5 days per week. "She is enjoying it".  Sister denies any further concerns or needs-  sister will reach out if needs arise.   Goals Addressed             This Visit's Progress    COMPLETED: Support for sister/caregiver to reduce stress       Care Coordination Interventions:  Pt's sister is primary caregiver and pt has lived with her and her husband for @20  years Sister does not have 49 and CSW discussed Guardianship as a suggestion Sister has secured a day program in [place] where pt goes- transportation is provided however they have had some struggles with pickup time, etc Solution-Focused Strategies employed:  Active listening / Reflection utilized  Emotional Support Provided Problem [place] strategies reviewed Provided psychoeducation for mental health needs  Caregiver stress acknowledged  Participation in counseling encouraged for sister due to caregiver stress/worry- pt to call her insurance to navigate options for in -network providers Sister to seek legal guidance for considering Guardianship of pt         SDOH assessments and interventions completed:  Yes     Care Coordination Interventions Activated:  Yes  Care Coordination Interventions:  Yes, provided   Follow up plan: No further intervention required.   Encounter Outcome:  Pt. Visit Completed   Eduard Clos MSW, LCSW Licensed Clinical Social Worker      [phone]

## 2021-11-29 IMAGING — MG MM DIGITAL SCREENING BILAT W/ TOMO AND CAD
8 series · 9 of 24 positions shown · non-contrast
Comparison: None.

CLINICAL DATA: Screening.

EXAM:
DIGITAL SCREENING BILATERAL MAMMOGRAM WITH TOMOSYNTHESIS AND CAD
TECHNIQUE: Bilateral screening digital craniocaudal and mediolateral oblique
mammograms were obtained. Bilateral screening digital breast
tomosynthesis was performed. The images were evaluated with
computer-aided detection.

[L CC synth-2D]
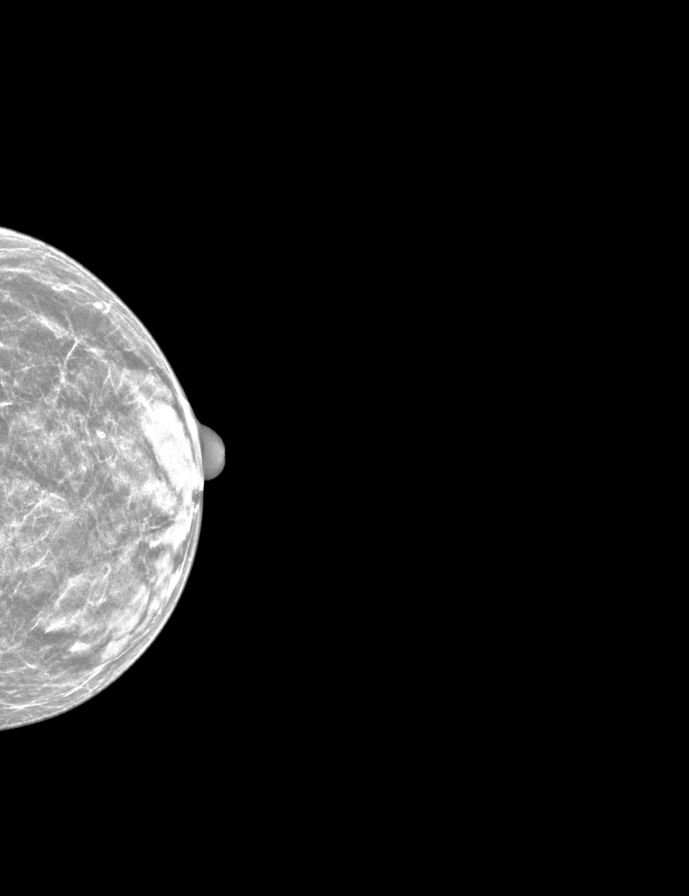

[L MLO synth-2D]
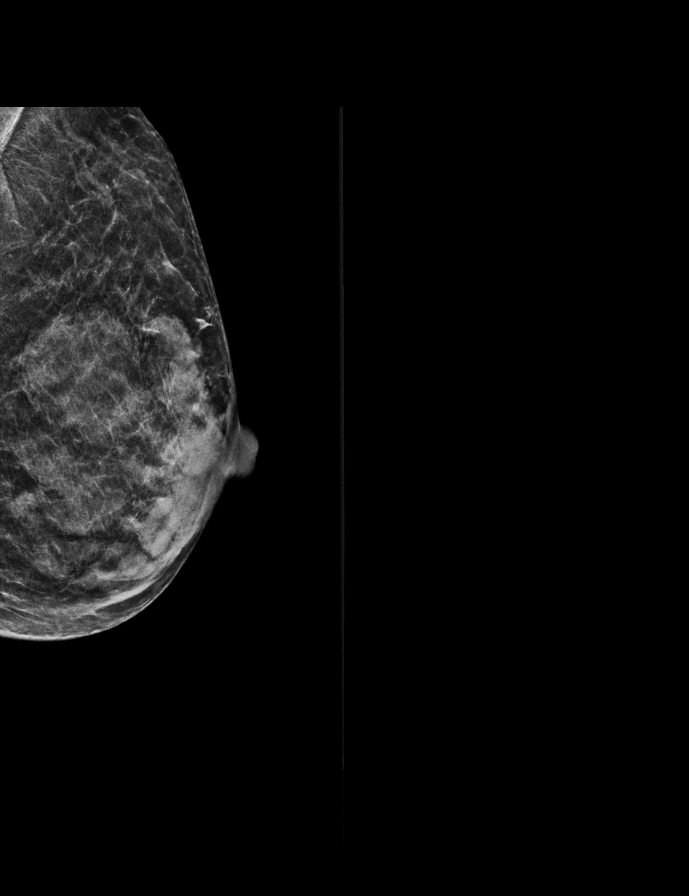

[R MLO synth-2D]
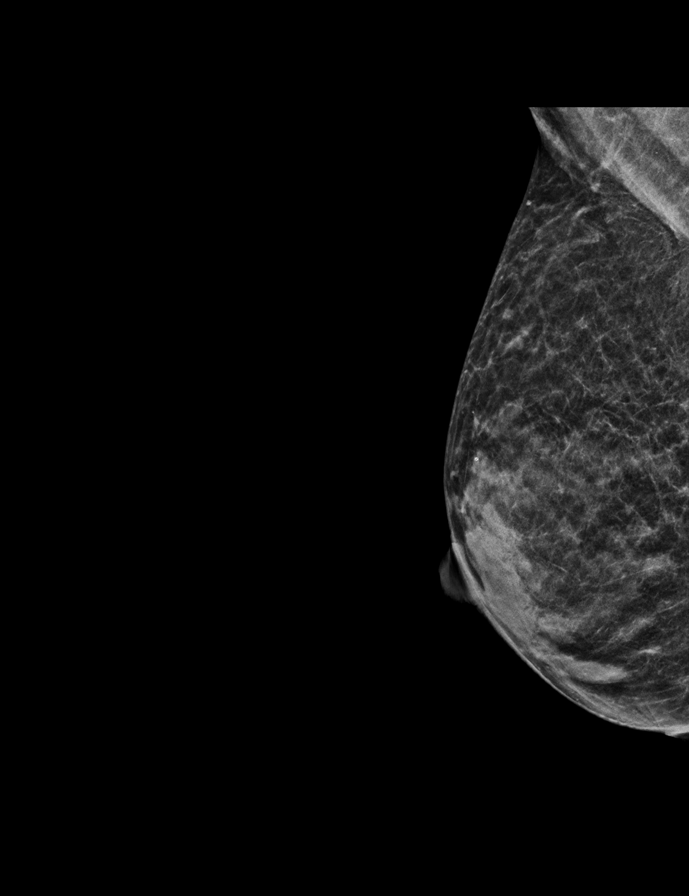

[R CC synth-2D]
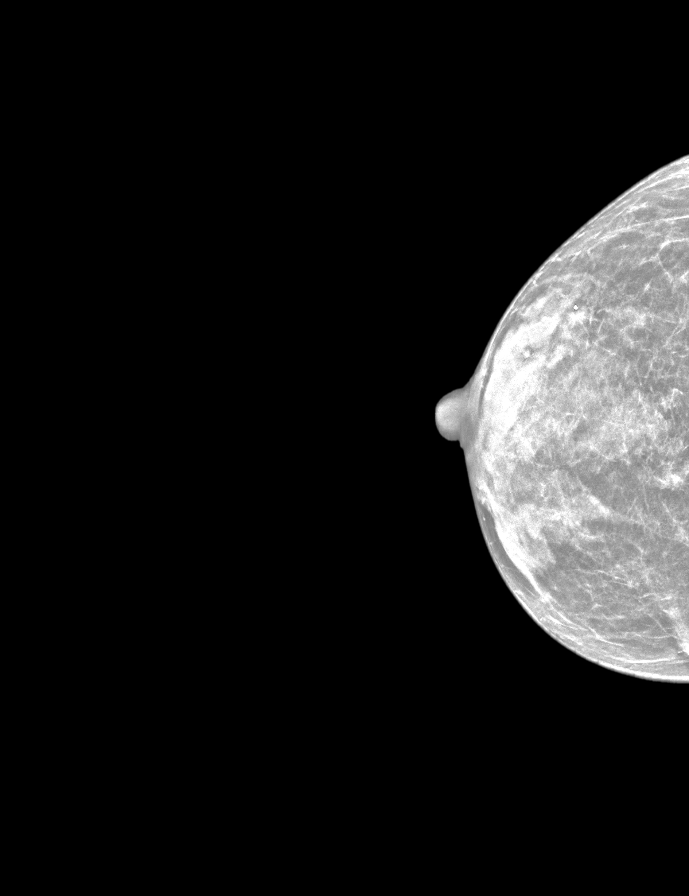

[R CC tomo · 2 of 38 frames shown]
[frame 13/38]
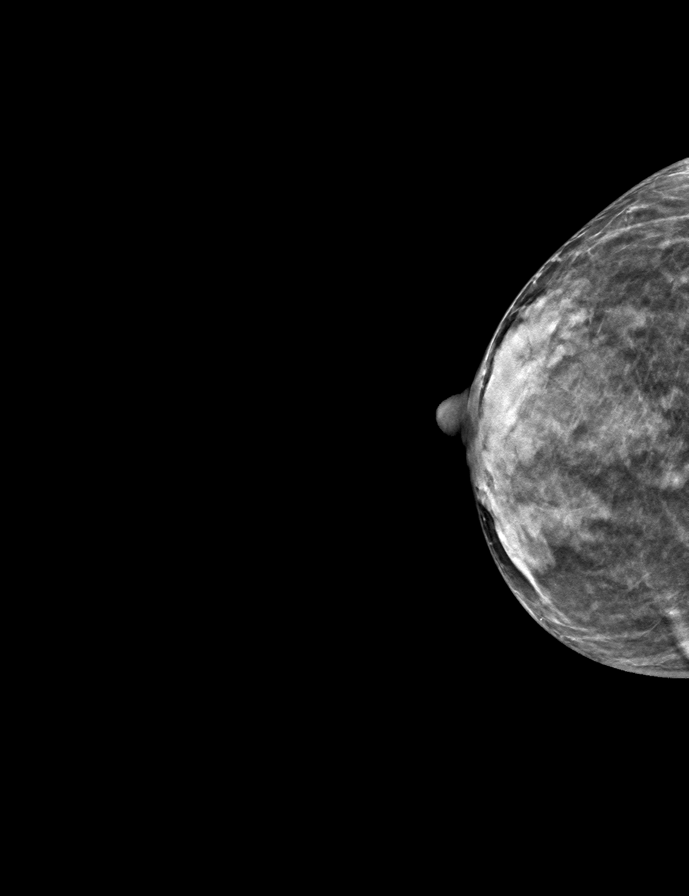
[frame 19/38]
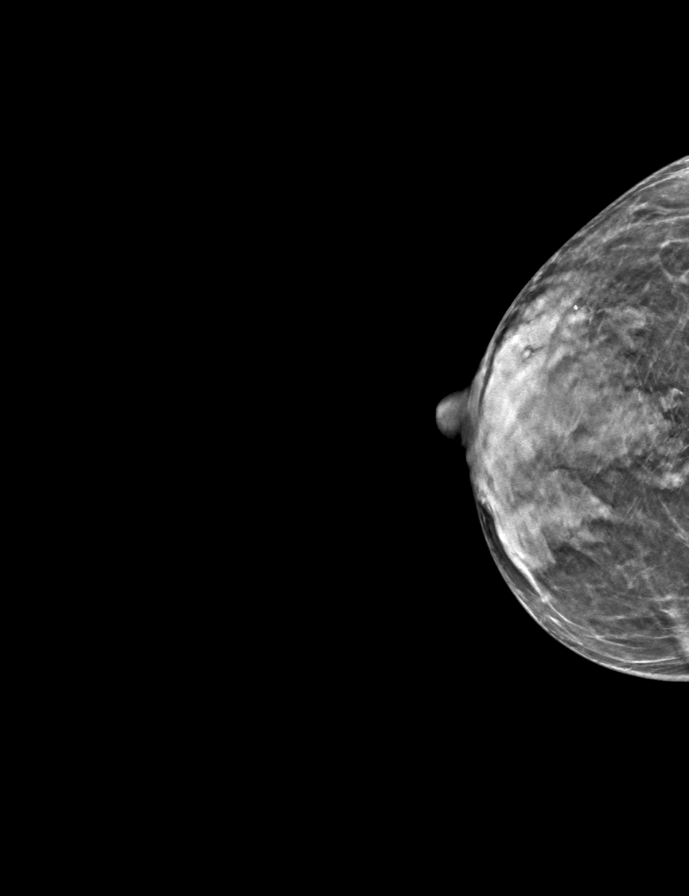

[L MLO tomo · tomo slice 19/37.0]
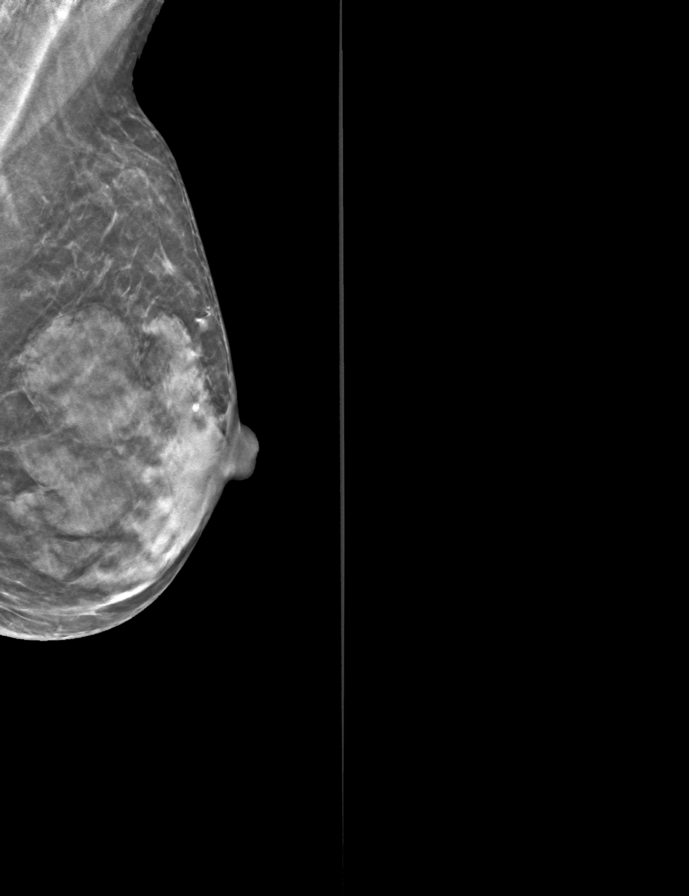

[L CC tomo · tomo slice 19/36.0]
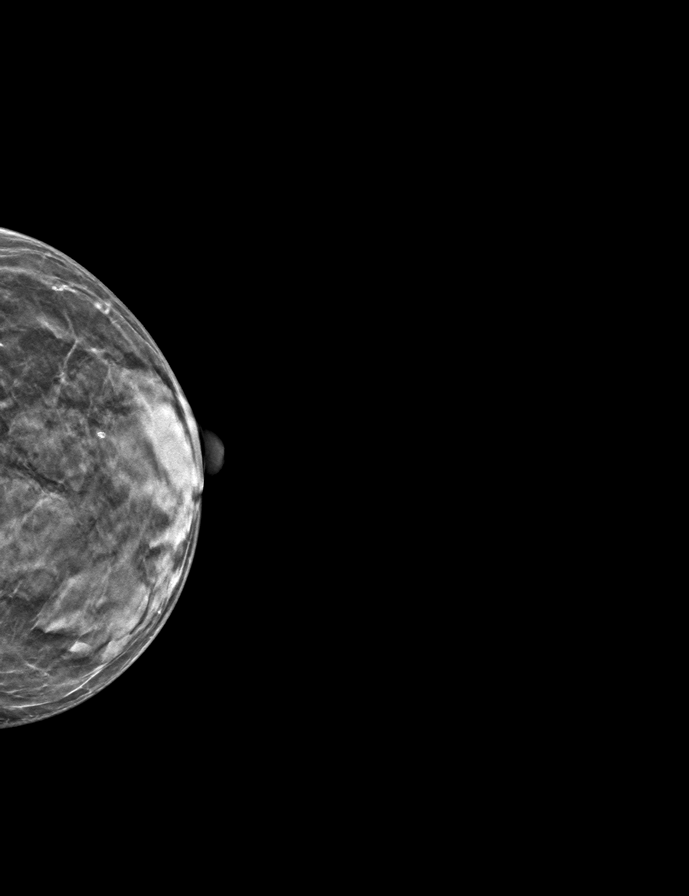

[R MLO tomo · tomo slice 20/39.0]
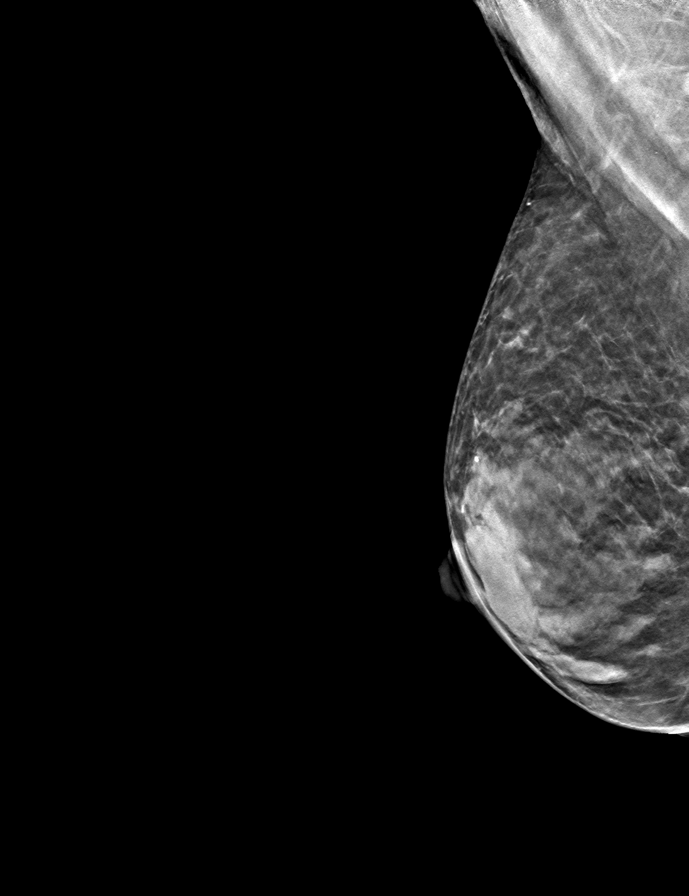

[9 of 24 positions shown; findings below may reference images not displayed]

ACR Breast Density Category c: The breast tissue is heterogeneously
dense, which may obscure small masses
FINDINGS: There are no findings suspicious for malignancy. The images were
evaluated with computer-aided detection.
IMPRESSION: No mammographic evidence of malignancy. A result letter of this
screening mammogram will be mailed directly to the patient.

RECOMMENDATION:
Screening mammogram in one year. (Code:CQ-1-UDH)

BI-RADS CATEGORY  1: Negative.

## 2022-02-21 ENCOUNTER — Ambulatory Visit (INDEPENDENT_AMBULATORY_CARE_PROVIDER_SITE_OTHER): Payer: Medicare HMO

## 2022-02-21 DIAGNOSIS — M810 Age-related osteoporosis without current pathological fracture: Secondary | ICD-10-CM

## 2022-02-21 DIAGNOSIS — I10 Essential (primary) hypertension: Secondary | ICD-10-CM

## 2022-02-21 NOTE — Progress Notes (Signed)
Chronic Care Management Pharmacy Note  02/21/2022 Name:  Ruth Nelson MRN:  [id number] DOB:  1940/11/21  Summary: Patient presents for CCM follow-up. Today's visit was 100% conducted with [name], the patient's sister and caregiver.   -She continues to go to the daycare, but her transportation is not always reliable. Patient's sister denies reports of recent outbursts at home or at daycare.   -Blood pressure remains well controlled, only requiring amlodipine ~1x weekly.   Recommendations/Changes made from today's visit: -Recommend initiation of Prolia 60 mg every 6 months.   Plan: CPP follow-up 6 months  Subjective: Ruth Nelson is an 81 y.o. year old female who is a primary patient of Nche, Charlene Brooke, NP.  The CCM team was consulted for assistance with disease management and care coordination needs.    Engaged with patient by telephone for follow up visit in response to provider referral for pharmacy case management and/or care coordination services.   Consent to Services:  The patient was given information about Chronic Care Management services, agreed to services, and gave verbal consent prior to initiation of services.  Please see initial visit note for detailed documentation.   Patient Care Team: Nche, Charlene Brooke, NP as PCP - General (Internal Medicine) Germaine Pomfret, [facility] (Pharmacist)  Recent office visits: 11/02/2021 Eduard Clos LCSW (CCM) No medication Changes noted 09/25/2021 Wilfred Lacy NP (PCP) Start citalopram 65m daily, Hold amlodipine - Give amlodipine 2.525monly if BP>140/80, AMB Referral to [place] about 4 weeks   Recent consult visits: None in previous 6 months  Hospital visits: None in previous 6 months   Objective:  Lab Results  Component Value Date   CREATININE 1.63 (H) 09/25/2021   BUN 15 09/25/2021   GFR 29.34 (L) 09/25/2021   NA 140 09/25/2021   K 5.3 (H) 09/25/2021   CALCIUM 9.5  09/25/2021   CO2 28 09/25/2021   GLUCOSE 81 09/25/2021    Lab Results  Component Value Date/Time   GFR 29.34 (L) 09/25/2021 10:41 AM   GFR 49.40 (L) 01/23/2021 12:09 PM    Last diabetic Eye exam: No results found for: "HMDIABEYEEXA"  Last diabetic Foot exam: No results found for: "HMDIABFOOTEX"   Lab Results  Component Value Date   CHOL 199 01/23/2021   HDL 73.10 01/23/2021   LDLCALC 110 (H) 01/23/2021   TRIG 78.0 01/23/2021   CHOLHDL 3 01/23/2021       Latest Ref Rng & Units 09/25/2021   10:41 AM 01/23/2021   12:09 PM 01/18/2020   10:18 AM  Hepatic Function  Total Protein 6.0 - 8.3 g/dL 7.7  7.2  7.4   Albumin 3.5 - 5.2 g/dL 3.9  4.3  4.0   AST 0 - 37 U/L _0 ALT 0 - 35 U/L _1 Alk Phosphatase 39 - 117 U/L 51  52  73   Total Bilirubin 0.2 - 1.2 mg/dL 0.3  0.6  0.4     Lab Results  Component Value Date/Time   TSH 5.12 09/25/2021 10:41 AM   TSH 1.81 01/23/2021 12:09 PM   FREET4 1.01 09/25/2021 10:41 AM   FREET4 0.87 01/23/2021 12:09 PM       Latest Ref Rng & Units 09/25/2021   10:41 AM 01/18/2020   10:18 AM  CBC  WBC 4.0 - 10.5 K/uL 4.4  6.7   Hemoglobin 12.0 - 15.0 g/dL 14.4  13.5   Hematocrit  36.0 - 46.0 % 45.1  41.5   Platelets 150.0 - 400.0 K/uL 299.0  270.0     No results found for: "VD25OH"  Clinical ASCVD: No  The ASCVD Risk score (Arnett DK, et al., 2019) failed to calculate for the following reasons:   The 2019 ASCVD risk score is only valid for ages 40 to 57       05/01/2021    3:06 PM 05/01/2021    2:59 PM 04/04/2020   10:14 AM  Depression screen PHQ 2/9  Decreased Interest 0 0 0  Down, Depressed, Hopeless 0 0 0  PHQ - 2 Score 0 0 0    -Last DEXA Scan: 09/04/20   T-Score femoral neck: -3.4  T-Score total hip: -3.4  T-Score lumbar spine: -3.7  Social History   Tobacco Use  Smoking Status Never  Smokeless Tobacco Never   BP Readings from Last 3 Encounters:  09/25/21 (!) 112/52  05/01/21 138/64  03/15/21 138/70    Pulse Readings from Last 3 Encounters:  09/25/21 94  05/01/21 81  03/15/21 60   Wt Readings from Last 3 Encounters:  09/25/21 111 lb (50.3 kg)  05/01/21 113 lb (51.3 kg)  03/15/21 113 lb (51.3 kg)   BMI Readings from Last 3 Encounters:  09/25/21 20.30 kg/m  05/01/21 20.67 kg/m  03/15/21 21.01 kg/m    Assessment/Interventions: Review of patient past medical history, allergies, medications, health status, including review of consultants reports, laboratory and other test data, was performed as part of comprehensive evaluation and provision of chronic care management services.   SDOH:  (Social Determinants of Health) assessments and interventions performed: Yes SDOH Interventions    Flowsheet Row Chronic Care Management from 11/22/2021 in [facility] from 11/02/2021 in [place] from 05/01/2021 in [facility] Interventions -- Intervention Not Indicated Intervention Not Indicated  Housing Interventions -- Intervention Not Indicated Intervention Not Indicated  Transportation Interventions Intervention Not Indicated Intervention Not Indicated Intervention Not Indicated  Financial Strain Interventions Intervention Not Indicated Intervention Not Indicated Intervention Not Indicated  Physical Activity Interventions -- -- Intervention Not Indicated  Stress Interventions -- -- Intervention Not Indicated  Social Connections Interventions -- Other (Comment)  [Pt attends a day program in [place]] Intervention Not Indicated      [place]: No Food Insecurity (11/02/2021)  Housing: Low Risk  (11/02/2021)  Transportation Needs: No Transportation Needs (11/22/2021)  Alcohol Screen: Low Risk  (05/01/2021)  Depression (PHQ2-9): Low Risk  (05/01/2021)  Financial Resource Strain: Low Risk  (11/22/2021)  Physical  Activity: Sufficiently Active (05/01/2021)  Social Connections: Moderately Integrated (11/02/2021)  Stress: No Stress Concern Present (05/01/2021)  Tobacco Use: Low Risk  (09/26/2021)    CCM Care Plan  No Known Allergies  Medications Reviewed Today     Reviewed by Flossie Buffy, NP (Nurse Practitioner) on 09/26/21 at 1526  Med List Status: <None>   Medication Order Taking? Sig Documenting Provider Last Dose Status Informant  alendronate (FOSAMAX) 70 MG tablet [id number] Yes Take 1 tablet (70 mg total) by mouth every 7 (seven) days. Take with a full glass of water on an empty stomach. Dutch Quint B, FNP Taking Active   amLODipine (NORVASC) 2.5 MG tablet [id number] Yes Take 1 tablet (2.5 mg total) by mouth daily. Flossie Buffy, NP Taking Active   aspirin EC 81 MG tablet [id number]  Yes Take 81 mg by mouth daily. Swallow whole. [provider] Taking Active   citalopram (CELEXA) 10 MG tablet [id number] Yes Take 1 tablet (10 mg total) by mouth daily. Nche, Charlene Brooke, NP  Active   lactulose (KRISTALOSE) 10 g packet [id number] Yes TAKE 1 PACKET (10 G TOTAL) BY MOUTH AT BEDTIME. HOLD IF DIARRHEA Nche, Charlene Brooke, NP Taking Active   levothyroxine (SYNTHROID) 25 MCG tablet [id number]  Take 1 tablet (25 mcg total) by mouth daily before breakfast. Nche, Charlene Brooke, NP  Active   Multiple Vitamin (ONE-A-DAY ESSENTIAL PO) [id number] Yes Take by mouth. [provider] Taking Active             Patient Active Problem List   Diagnosis Date Noted   Aggressive behavior of adult 09/25/2021   Age related osteoporosis 01/23/2021   HTN (hypertension), benign 05/02/2020   Other specified hypothyroidism 05/02/2020   Stage 3b chronic kidney disease ([place]) 05/02/2020    Immunization History  Administered Date(s) Administered   Fluad Quad(high Dose 65+) 01/14/2020, 12/27/2020   Influenza,inj,Quad PF,6+ Mos 10/23/2021   Moderna Sars-Covid-2 Vaccination 03/28/2019, 04/25/2019,  02/28/2020   PNEUMOCOCCAL CONJUGATE-20 12/27/2020   Pfizer Covid-19 Vaccine Bivalent Booster 49yr & up 02/12/2021    Conditions to be addressed/monitored:  Hypertension, Chronic Kidney Disease, Hypothyroidism, Osteoporosis, and mental retardation  Care Plan : General Pharmacy (Adult)  Updates made by FGermaine Pomfret RPH since 02/21/2022 12:00 AM     Problem: Hypertension, Chronic Kidney Disease, Hypothyroidism, Osteoporosis, and mental retardation   Priority: High     Long-Range Goal: Patient-Specific Goal   Start Date: 11/22/2021  Expected End Date: 11/23/2022  This Visit's Progress: On track  Recent Progress: On track  Priority: High  Note:   Current Barriers:  Suboptimal therapeutic regimen for osteoporosis, mental retardation  Pharmacist Clinical Goal(s):  Patient will adhere to plan to optimize therapeutic regimen for osteoporosis and mental retardation as evidenced by report of adherence to recommended medication management changes through collaboration with PharmD and provider.   Interventions: 1:1 collaboration with Nche, CCharlene Brooke NP regarding development and update of comprehensive plan of care as evidenced by provider attestation and co-signature Inter-disciplinary care team collaboration (see longitudinal plan of care) Comprehensive medication review performed; medication list updated in electronic medical record  Hypertension (BP goal <140/90) -Controlled -Current treatment: Amlodipine 2.5 mg daily only if BP is over 140/90 Takes medication ~1x weekly.  -Medications previously tried: NA   -Denies hypotensive/hypertensive symptoms -Recommended to continue current medication  Mental Retardation (Goal: Minimize outbursts and maintain quality of life) -Controlled -Current treatment: Citalopram 10 mg daily  -Medications previously tried/failed: NA -She continues to go to the daycare, but her transportation is not always reliable. Patient's sister  denies reports of recent outbursts at home or at daycare.  -Continue current medications   Osteoporosis (Goal Prevent bone fractures) -Uncontrolled -Patient is a candidate for pharmacologic treatment due to T-Score < -2.5 in femoral neck, T-Score < -2.5 in total hip , and T-Score < -2.5 in lumbar spine -Current treatment  Multivitamin daily  -Medications previously tried: Alendronate -Patient's sister states she was instructed by PCP to stop alendronate, although she is unsure why. Given CKD 3b, would not recommend bisphosphonate in this patient.  -Recommend initiation of Prolia 60 mg every 6 months.   Hypothyroidism (Goal: Maintain thyroid function) -Controlled -Current treatment  Levothyroxine 25 mcg daily  -Medications previously tried: NA  -Recommended to continue current medication  Constipation (Goal: Improve symptoms) -Controlled -  Current treatment  Bisacodyl 5 mg daily  Lactulose daily  -Medications previously tried: NA -Patient is not able to communicate when she is having symptoms of constipation.  -Recommended to continue current medication  Chronic Kidney Disease Stage 3b  -All medications assessed for renal dosing and appropriateness in chronic kidney disease. -Recommended to continue current medication  Patient Goals/Self-Care Activities Patient will:  - check blood pressure every other day, document, and provide at future appointments  Follow Up Plan: Telephone follow up appointment with care management team member scheduled for:  09/12/2022 at 11:00 AM    Medication Assistance: None required.  Patient affirms current coverage meets needs.  Compliance/Adherence/Medication fill history: Care Gaps: COVID-19 Vaccine  Star-Rating Drugs: None ID  Patient's preferred pharmacy is:  [id number] [employer], [place] - [address]Phone: [phone]:  [phone] Uses pill box? Yes Pt endorses 100% compliance  We discussed: Current pharmacy is preferred with insurance plan and patient is satisfied with pharmacy services Patient decided to: Continue current medication management strategy  Care Plan and Follow Up Patient Decision:  Patient agrees to Care Plan and Follow-up.  Plan: Telephone follow up appointment with care management team member scheduled for:  09/12/2022 at 11:00 AM  AJunius Argyle PharmD, BPara March CPP Clinical Pharmacist Practitioner  [facility] [phone]

## 2022-02-21 NOTE — Patient Instructions (Signed)
Visit Information It was great speaking with you today!  Please let me know if you have any questions about our visit.   Goals Addressed   None     Patient Care Plan: General Pharmacy (Adult)     Problem Identified: Hypertension, Chronic Kidney Disease, Hypothyroidism, Osteoporosis, and mental retardation   Priority: High     Long-Range Goal: Patient-Specific Goal   Start Date: 11/22/2021  Expected End Date: 11/23/2022  This Visit's Progress: On track  Recent Progress: On track  Priority: High  Note:   Current Barriers:  Suboptimal therapeutic regimen for osteoporosis, mental retardation  Pharmacist Clinical Goal(s):  Patient will adhere to plan to optimize therapeutic regimen for osteoporosis and mental retardation as evidenced by report of adherence to recommended medication management changes through collaboration with PharmD and provider.   Interventions: 1:1 collaboration with Nche, Charlene Brooke, NP regarding development and update of comprehensive plan of care as evidenced by provider attestation and co-signature Inter-disciplinary care team collaboration (see longitudinal plan of care) Comprehensive medication review performed; medication list updated in electronic medical record  Hypertension (BP goal <140/90) -Controlled -Current treatment: Amlodipine 2.5 mg daily only if BP is over 140/90 Takes medication ~1x weekly.  -Medications previously tried: NA   -Denies hypotensive/hypertensive symptoms -Recommended to continue current medication  Mental Retardation (Goal: Minimize outbursts and maintain quality of life) -Controlled -Current treatment: Citalopram 10 mg daily  -Medications previously tried/failed: NA -She continues to go to the daycare, but her transportation is not always reliable. Patient's sister denies reports of recent outbursts at home or at daycare.  -Continue current medications   Osteoporosis (Goal Prevent bone  fractures) -Uncontrolled -Patient is a candidate for pharmacologic treatment due to T-Score < -2.5 in femoral neck, T-Score < -2.5 in total hip , and T-Score < -2.5 in lumbar spine -Current treatment  Multivitamin daily  -Medications previously tried: Alendronate -Patient's sister states she was instructed by PCP to stop alendronate, although she is unsure why. Given CKD 3b, would not recommend bisphosphonate in this patient.  -Recommend initiation of Prolia 60 mg every 6 months.   Hypothyroidism (Goal: Maintain thyroid function) -Controlled -Current treatment  Levothyroxine 25 mcg daily  -Medications previously tried: NA  -Recommended to continue current medication  Constipation (Goal: Improve symptoms) -Controlled -Current treatment  Bisacodyl 5 mg daily  Lactulose daily  -Medications previously tried: NA -Patient is not able to communicate when she is having symptoms of constipation.  -Recommended to continue current medication  Chronic Kidney Disease Stage 3b  -All medications assessed for renal dosing and appropriateness in chronic kidney disease. -Recommended to continue current medication  Patient Goals/Self-Care Activities Patient will:  - check blood pressure every other day, document, and provide at future appointments  Follow Up Plan: Telephone follow up appointment with care management team member scheduled for:  09/12/2022 at 11:00 AM    Patient agreed to services and verbal consent obtained.   The patient verbalized understanding of instructions, educational materials, and care plan provided today and DECLINED offer to receive copy of patient instructions, educational materials, and care plan.   Junius Argyle, PharmD, Para [place], CPP Clinical Pharmacist Practitioner  [facility]  [phone]

## 2022-02-24 DIAGNOSIS — E039 Hypothyroidism, unspecified: Secondary | ICD-10-CM

## 2022-02-24 DIAGNOSIS — N1832 Chronic kidney disease, stage 3b: Secondary | ICD-10-CM

## 2022-02-24 DIAGNOSIS — I129 Hypertensive chronic kidney disease with stage 1 through stage 4 chronic kidney disease, or unspecified chronic kidney disease: Secondary | ICD-10-CM

## 2022-02-24 DIAGNOSIS — M810 Age-related osteoporosis without current pathological fracture: Secondary | ICD-10-CM

## 2022-03-08 ENCOUNTER — Other Ambulatory Visit: Payer: Self-pay | Admitting: Nurse Practitioner

## 2022-03-08 DIAGNOSIS — I10 Essential (primary) hypertension: Secondary | ICD-10-CM

## 2022-03-08 NOTE — Telephone Encounter (Signed)
Chart supports Rx Last OV: 09/2021 Next OV: 04/2022

## 2022-04-08 ENCOUNTER — Other Ambulatory Visit: Payer: Self-pay | Admitting: Nurse Practitioner

## 2022-04-08 DIAGNOSIS — E038 Other specified hypothyroidism: Secondary | ICD-10-CM

## 2022-04-12 ENCOUNTER — Telehealth: Payer: Self-pay | Admitting: Nurse Practitioner

## 2022-04-12 NOTE — Telephone Encounter (Signed)
Ruth Nelson was concerned because [name]'s BP was 180/40 this morning.  She seemed a little anxious because the bus was at their home to pick her up and she didn't want to miss the bus. He thinks this was the reason her bp was elevated otherwise it's been normal.  He didn't get to check her bp again because she was not home when I called.  I advised him to continue monitoring her blood pressure, if it continues to be elevated call us back  or if  she shows signs of stroke or heart attack take her to the ED.

## 2022-04-12 NOTE — Telephone Encounter (Signed)
Ruth Nelson  [phone]  He would like to talk to [facility] about the pts BP and medication.

## 2022-04-15 NOTE — Telephone Encounter (Signed)
They will call back to schedule appointment.  They were driving at the time.

## 2022-04-17 NOTE — Telephone Encounter (Signed)
They will call back, they need to check schedule

## 2022-04-24 ENCOUNTER — Telehealth: Payer: Self-pay | Admitting: Nurse Practitioner

## 2022-04-24 ENCOUNTER — Encounter: Payer: Self-pay | Admitting: Nurse Practitioner

## 2022-04-24 ENCOUNTER — Ambulatory Visit (INDEPENDENT_AMBULATORY_CARE_PROVIDER_SITE_OTHER): Payer: Medicare HMO | Admitting: Nurse Practitioner

## 2022-04-24 ENCOUNTER — Other Ambulatory Visit: Payer: Self-pay | Admitting: Nurse Practitioner

## 2022-04-24 VITALS — BP 140/72 | HR 95 | Temp 99.0°F | Resp 16 | Ht 62.0 in | Wt 112.0 lb

## 2022-04-24 DIAGNOSIS — I10 Essential (primary) hypertension: Secondary | ICD-10-CM

## 2022-04-24 DIAGNOSIS — E038 Other specified hypothyroidism: Secondary | ICD-10-CM

## 2022-04-24 DIAGNOSIS — R051 Acute cough: Secondary | ICD-10-CM

## 2022-04-24 DIAGNOSIS — N184 Chronic kidney disease, stage 4 (severe): Secondary | ICD-10-CM | POA: Diagnosis not present

## 2022-04-24 DIAGNOSIS — F819 Developmental disorder of scholastic skills, unspecified: Secondary | ICD-10-CM

## 2022-04-24 LAB — BASIC METABOLIC PANEL
BUN: 27 mg/dL — ABNORMAL HIGH (ref 6–23)
CO2: 27 mEq/L (ref 19–32)
Calcium: 9.4 mg/dL (ref 8.4–10.5)
Chloride: 110 mEq/L (ref 96–112)
Creatinine, Ser: 1.9 mg/dL — ABNORMAL HIGH (ref 0.40–1.20)
GFR: 24.31 mL/min — ABNORMAL LOW (ref >60.00)
Glucose, Bld: 79 mg/dL (ref 70–99)
Potassium: 5.8 mEq/L — ABNORMAL HIGH (ref 3.5–5.1)
Sodium: 144 mEq/L (ref 135–145)

## 2022-04-24 LAB — T4, FREE: Free T4: 0.94 ng/dL (ref 0.60–1.60)

## 2022-04-24 LAB — POCT INFLUENZA A/B
Influenza A, POC: NEGATIVE
Influenza B, POC: NEGATIVE

## 2022-04-24 LAB — TSH: TSH: 5.98 u[IU]/mL — ABNORMAL HIGH (ref 0.35–5.50)

## 2022-04-24 LAB — POC COVID19 BINAXNOW: SARS Coronavirus 2 Ag: NEGATIVE

## 2022-04-24 MED ORDER — LEVOTHYROXINE SODIUM 50 MCG PO TABS: 50.0000 ug | ORAL_TABLET | Freq: Every day | ORAL | 1 refills | Status: DC

## 2022-04-24 MED ORDER — AMLODIPINE BESYLATE 5 MG PO TABS: 5.0000 mg | ORAL_TABLET | Freq: Every day | ORAL | 1 refills | Status: DC

## 2022-04-24 NOTE — Progress Notes (Addendum)
Established Patient Visit  Patient: Ruth Nelson   DOB: [birth year]   82 y.o. Female  MRN: [id number] Visit Date: 04/24/2022  Subjective:    Chief Complaint  Patient presents with   Hypertension   Cough    Bp fluxuation and cough   140/80 yesterday     Cough This is a new problem. The current episode started today. The problem has been unchanged. The problem occurs every few minutes. The cough is Non-productive. Associated symptoms include a fever. Pertinent negatives include no chest pain, chills, headaches, myalgias, nasal congestion, postnasal drip, rhinorrhea, shortness of breath or wheezing. Nothing aggravates the symptoms. She has tried nothing for the symptoms. There is no history of asthma, bronchiectasis, bronchitis, COPD, emphysema, environmental allergies or pneumonia.   Accompanied by [name] who provided all history HTN (hypertension), benign Reports home BP 140s-150s/80s Asymptomatic Current use of amlodipine 2.'5mg'$  daily BP Readings from Last 3 Encounters:  04/24/22 (!) 140/72  09/25/21 (!) 112/52  05/01/21 138/64    Increase amlodipine to '5mg'$  daily Continue to monitor BP daily F/up in 26month Other specified hypothyroidism Repeat TSh and T4: Elevated TSH and normal T4: increased levothyroxine dose to 58m. Ensure med is taken on empty stomach and separate from other meds by at least 307m. Repeat labs in 6-8weeks  CKD (chronic kidney disease) stage 4, GFR 15-29 ml/min (HCC) Repeat BMP: stable with persistent mildy elevated potassium. She needs to maintain at least 60oz of water daily. Need to avoid all NSAIDs (ibuprofen, aleve, naproxen or motrin) Stop lactulose Schedule lab appt for repeat BMP in 2weeks. She will be contacted to schedule appt for renal [place] developmental delay Per sister- [name] Stable mood  [facility] able to respond to simple questions. current use to  citalopram  BP Readings from Last 3 Encounters:  04/24/22 (!) 140/72  09/25/21 (!) 112/52  05/01/21 138/64    Wt Readings from Last 3 Encounters:  04/24/22 112 lb (50.8 kg)  09/25/21 111 lb (50.3 kg)  05/01/21 113 lb (51.3 kg)    Reviewed medical, surgical, and social history today  Medications: Outpatient Medications Prior to Visit  Medication Sig   aspirin EC 81 MG tablet Take 81 mg by mouth daily. Swallow whole.   bisacodyl (DULCOLAX) 5 MG EC tablet Take 5 mg by mouth daily as needed for moderate constipation.   citalopram (CELEXA) 10 MG tablet TAKE 1 TABLET BY MOUTH EVERY DAY   Multiple Vitamin (ONE-A-DAY ESSENTIAL PO) Take by mouth. Women's One-A-Day   [DISCONTINUED] amLODipine (NORVASC) 2.5 MG tablet TAKE 1 TABLET BY MOUTH EVERY DAY   [DISCONTINUED] lactulose (KRISTALOSE) 10 g packet TAKE 1 PACKET (10 G TOTAL) BY MOUTH AT BEDTIME. HOLD IF DIARRHEA   [DISCONTINUED] levothyroxine (SYNTHROID) 25 MCG tablet TAKE 1 TABLET BY MOUTH DAILY BEFORE BREAKFAST.   No facility-administered medications prior to visit.   Reviewed past medical and social history.   ROS per HPI above      Objective:  BP (!) 140/72 (BP Location: Left Arm)   Pulse 95   Temp 99 F (37.2 C) (Oral)   Resp 16   Ht '5\' 2"'$  (1.575 m)   Wt 112 lb (50.8 kg)   SpO2 98%   BMI 20.49 kg/m      Physical Exam Vitals reviewed.  Cardiovascular:     Rate and Rhythm: Normal rate  and regular rhythm.     Pulses: Normal pulses.     Heart sounds: Normal heart sounds.  Pulmonary:     Effort: Pulmonary effort is normal.     Breath sounds: Normal breath sounds.  Musculoskeletal:     Right lower leg: No edema.     Left lower leg: No edema.  Neurological:     Mental Status: She is alert and oriented to person, place, and time.     Results for orders placed or performed in visit on 04/24/22  TSH  Result Value Ref Range   TSH 5.98 (H) 0.35 - 5.50 uIU/mL  T4, free  Result Value Ref Range   Free T4 0.94 0.60 -  1.60 ng/dL  Basic metabolic panel  Result Value Ref Range   Sodium 144 135 - 145 mEq/L   Potassium 5.8 No hemolysis seen (H) 3.5 - 5.1 mEq/L   Chloride 110 96 - 112 mEq/L   CO2 27 19 - 32 mEq/L   Glucose, Bld 79 70 - 99 mg/dL   BUN 27 (H) 6 - 23 mg/dL   Creatinine, Ser 1.90 (H) 0.40 - 1.20 mg/dL   GFR 24.31 (L) >60.00 mL/min   Calcium 9.4 8.4 - 10.5 mg/dL  POC COVID-19  Result Value Ref Range   SARS Coronavirus 2 Ag Negative Negative  POCT Influenza A/B  Result Value Ref Range   Influenza A, POC Negative Negative   Influenza B, POC Negative Negative      Assessment & Plan:    Problem List Items Addressed This Visit       Cardiovascular and Mediastinum   HTN (hypertension), benign - Primary    Reports home BP 140s-150s/80s Asymptomatic Current use of amlodipine 2.'5mg'$  daily BP Readings from Last 3 Encounters:  04/24/22 (!) 140/72  09/25/21 (!) 112/52  05/01/21 138/64    Increase amlodipine to '5mg'$  daily Continue to monitor BP daily F/up in 50month     Relevant Medications   amLODipine (NORVASC) 5 MG tablet   Other Relevant Orders   Basic metabolic panel (Completed)   [place] Artery Stenosis     Endocrine   Other specified hypothyroidism    Repeat TSh and T4: Elevated TSH and normal T4: increased levothyroxine dose to 563m. Ensure med is taken on empty stomach and separate from other meds by at least 3037m. Repeat labs in 6-8weeks      Relevant Orders   TSH (Completed)   T4, free (Completed)     Genitourinary   CKD (chronic kidney disease) stage 4, GFR 15-29 ml/min (HCC)    Repeat BMP: stable with persistent mildy elevated potassium. She needs to maintain at least 60oz of water daily. Need to avoid all NSAIDs (ibuprofen, aleve, naproxen or motrin) Stop lactulose Schedule lab appt for repeat BMP in 2weeks. She will be contacted to schedule appt for renal [place]   Relevant Orders   Basic metabolic panel (Completed)   Basic metabolic panel    Microalbumin / creatinine urine ratio   Protein / creatinine ratio, urine   [place] Artery Stenosis     Other   Mental developmental delay    Per sister- [name] Stable mood  [facility] behavioral disturbance [facility] able to respond to simple questions. current use to citalopram      Other Visit Diagnoses     Acute cough       Relevant Orders   POC COVID-19 (Completed)  POCT Influenza A/B (Completed)      Return in about 4 weeks (around 05/22/2022) for HTN.     Wilfred Lacy, NP

## 2022-04-24 NOTE — Assessment & Plan Note (Addendum)
Repeat TSh and T4: Elevated TSH and normal T4: increased levothyroxine dose to 12mg. Ensure med is taken on empty stomach and separate from other meds by at least 345ms. Repeat labs in 6-8weeks

## 2022-04-24 NOTE — Telephone Encounter (Signed)
FYI:Pt would like Baldo Ash to know she is taking Dulcolax(laxative).

## 2022-04-24 NOTE — Addendum Note (Signed)
Addended by: Leana Gamer on: 04/24/2022 03:04 PM   Modules accepted: Orders

## 2022-04-24 NOTE — Assessment & Plan Note (Signed)
Reports home BP 140s-150s/80s Asymptomatic Current use of amlodipine 2.'5mg'$  daily BP Readings from Last 3 Encounters:  04/24/22 (!) 140/72  09/25/21 (!) 112/52  05/01/21 138/64    Increase amlodipine to '5mg'$  daily Continue to monitor BP daily F/up in 38month

## 2022-04-24 NOTE — Assessment & Plan Note (Addendum)
Repeat BMP: stable with persistent mildy elevated potassium. She needs to maintain at least 60oz of water daily. Need to avoid all NSAIDs (ibuprofen, aleve, naproxen or motrin) Stop lactulose Schedule lab appt for repeat BMP in 2weeks. She will be contacted to schedule appt for renal US

## 2022-04-24 NOTE — Patient Instructions (Addendum)
Go to lab [facility] to use coricidin or mucinex DM or robitussin as needed for cough. Use tylenol for fever >101. Increase amlodipine to '5mg'$  daily Continue daily BP check

## 2022-04-24 NOTE — Assessment & Plan Note (Addendum)
Per sister- Ruth Nelson mood  No behavioral disturbance at [name] is able to respond to simple questions. current use to citalopram

## 2022-05-13 ENCOUNTER — Ambulatory Visit (INDEPENDENT_AMBULATORY_CARE_PROVIDER_SITE_OTHER): Payer: Medicare HMO

## 2022-05-13 VITALS — Ht 60.0 in | Wt 110.0 lb

## 2022-05-13 DIAGNOSIS — Z0000 Encounter for general adult medical examination without abnormal findings: Secondary | ICD-10-CM

## 2022-05-13 NOTE — Progress Notes (Signed)
I connected with  [name] on 05/13/22 by a audio enabled telemedicine application and verified that I am speaking with the correct person using two identifiers. Sister Ladell Pier was also on the call.  Patient Location: Home  Provider Location: Office/Clinic  I discussed the limitations of evaluation and management by telemedicine. The patient expressed understanding and agreed to proceed.  Subjective:   Ruth Nelson is a 82 y.o. female who presents for Medicare Annual (Subsequent) preventive examination.  Review of Systems     Cardiac Risk Factors include: advanced age (>31men, >70 women);hypertension     Objective:    Today's Vitals   05/13/22 1442  Weight: 110 lb (49.9 kg)  Height: 5' (1.524 m)   Body mass index is 21.48 kg/m.     05/13/2022    2:47 PM 05/01/2021    3:00 PM 04/04/2020   10:10 AM  Advanced Directives  Does Patient Have a Medical Advance Directive? No No No  Would patient like information on creating a medical advance directive?  No - Guardian declined No - Patient declined    Current Medications (verified) Outpatient Encounter Medications as of 05/13/2022  Medication Sig   amLODipine (NORVASC) 5 MG tablet Take 1 tablet (5 mg total) by mouth daily.   aspirin EC 81 MG tablet Take 81 mg by mouth daily. Swallow whole.   bisacodyl (DULCOLAX) 5 MG EC tablet Take 5 mg by mouth daily as needed for moderate constipation.   citalopram (CELEXA) 10 MG tablet TAKE 1 TABLET BY MOUTH EVERY DAY   levothyroxine (SYNTHROID) 50 MCG tablet Take 1 tablet (50 mcg total) by mouth daily before breakfast.   Multiple Vitamin (ONE-A-DAY ESSENTIAL PO) Take by mouth. Women's One-A-Day   No facility-administered encounter medications on file as of 05/13/2022.    Allergies (verified) Patient has no known allergies.   History: History reviewed. No pertinent past medical history. History reviewed. No pertinent surgical history. History reviewed. No pertinent family  history. Social History   Socioeconomic History   Marital status: Single    Spouse name: Not on file   Number of children: Not on file   Years of education: Not on file   Highest education level: Not on file  Occupational History   Not on file  Tobacco Use   Smoking status: Never   Smokeless tobacco: Never  Vaping Use   Vaping Use: Never used  Substance and Sexual Activity   Alcohol use: Never   Drug use: Never   Sexual activity: Not Currently  Other Topics Concern   Not on file  Social History Narrative   Not on file   Social Determinants of Health   Financial Resource Strain: Low Risk  (05/13/2022)   Overall Financial Resource Strain (CARDIA)    Difficulty of Paying Living Expenses: Not hard at all  Food Insecurity: No Food Insecurity (05/13/2022)   Hunger Vital Sign    Worried About Running Out of Food in the Last Year: Never true    [place] in the Last Year: Never true  Transportation Needs: No Transportation Needs (05/13/2022)   PRAPARE - Hydrologist (Medical): No    Lack of Transportation (Non-Medical): No  Physical Activity: Sufficiently Active (05/13/2022)   Exercise Vital Sign    Days of Exercise per Week: 5 days    Minutes of Exercise per Session: 30 min  Stress: No Stress Concern Present (05/13/2022)   [place] -  Occupational Stress Questionnaire    Feeling of Stress : Not at all  Social Connections: Unknown (11/02/2021)   Social Connection and Isolation Panel [NHANES]    Frequency of Communication with Friends and Family: Three times a week    Frequency of Social Gatherings with Friends and Family: Three times a week    Attends Religious Services: 1 to 4 times per year    Active Member of Clubs or Organizations: Yes    Attends Music therapist: More than 4 times per year    Marital Status: Not on file    Tobacco Counseling Counseling given: Not Answered   Clinical  Intake:  Pre-visit preparation completed: Yes  Pain : No/denies pain     Nutritional Risks: None Diabetes: No  How often do you need to have someone help you when you read instructions, pamphlets, or other written materials from your doctor or pharmacy?: 3 - Sometimes  Diabetic? no  Interpreter Needed?: No  Information entered by :: NAllen LPN   Activities of Daily Living    05/13/2022    2:48 PM  In your present state of health, do you have any difficulty performing the following activities:  Hearing? 0  Vision? 0  Difficulty concentrating or making decisions? 1  Walking or climbing stairs? 0  Dressing or bathing? 0  Doing errands, shopping? 1  Preparing Food and eating ? N  Using the Toilet? N  In the past six months, have you accidently leaked urine? N  Do you have problems with loss of bowel control? N  Managing your Medications? Y  Managing your Finances? Y  Housekeeping or managing your Housekeeping? N    Patient Care Team: Nche, Charlene Brooke, NP as PCP - General (Internal Medicine) Germaine Pomfret, [facility] (Pharmacist)  Indicate any recent Medical Services you may have received from other than [facility] providers in the past year (date may be approximate).     Assessment:   This is a routine wellness examination for Ruth Nelson.  Hearing/Vision screen Vision Screening - Comments:: Regular eye exams, [facility]  Dietary issues and exercise activities discussed: Current Exercise Habits: Home exercise routine, Type of exercise: calisthenics, Time (Minutes): 30, Frequency (Times/Week): 5, Weekly Exercise (Minutes/Week): 150   Goals Addressed             This Visit's Progress    Patient Stated       05/13/2022, no goals       Depression Screen    05/13/2022    2:48 PM 04/24/2022    8:38 AM 05/01/2021    3:06 PM 05/01/2021    2:59 PM 04/04/2020   10:14 AM  PHQ 2/9 Scores  PHQ - 2 Score 0 0 0 0 0    Fall Risk    05/13/2022    2:48 PM 04/24/2022    8:38 AM  05/01/2021    3:01 PM 01/23/2021    2:51 PM 01/23/2021   11:49 AM  Fall Risk   Falls in the past year? 0 0 0 0 0  Number falls in past yr: 0 0 0 0 0  Injury with Fall? 0 0 0 0 0  Risk for fall due to : Medication side effect No Fall Risks No Fall Risks No Fall Risks No Fall Risks  Follow up Falls prevention discussed;Education provided;Falls evaluation completed Falls evaluation completed  Falls evaluation completed Falls evaluation completed    FALL RISK PREVENTION PERTAINING TO THE HOME:  Any stairs in or around  the home? No  If so, are there any without handrails? N/a Home free of loose throw rugs in walkways, pet beds, electrical cords, etc? Yes  Adequate lighting in your home to reduce risk of falls? Yes   ASSISTIVE DEVICES UTILIZED TO PREVENT FALLS:  Life alert? No  Use of a cane, walker or w/c? No  Grab bars in the bathroom? Yes  Shower chair or bench in shower? No  Elevated toilet seat or a handicapped toilet? No   TIMED UP AND GO:  Was the test performed? No .      Cognitive Function:  6 CIT not administered. Patient has a diagnosis of developmental delay.      01/14/2020    1:16 PM  MMSE - Mini Mental State Exam  Orientation to time 0  Orientation to Place 0  Registration 3  Attention/ Calculation 0  Recall 0  Language- name 2 objects 2  Language- repeat 1  Language- follow 3 step command 3  Language- read & follow direction 1  Write a sentence 0  Copy design 0  Total score 10        Immunizations Immunization History  Administered Date(s) Administered   Covid-19, Mrna,Vaccine(Spikevax)35yrs and older 03/01/2022   Fluad Quad(high Dose 65+) 01/14/2020, 12/27/2020   Influenza,inj,Quad PF,6+ Mos 10/23/2021   Moderna Sars-Covid-2 Vaccination 03/28/2019, 04/25/2019, 02/28/2020   PNEUMOCOCCAL CONJUGATE-20 12/27/2020, 03/01/2022   Pfizer Covid-19 Vaccine Bivalent Booster 47yrs & up 02/12/2021   Zoster Recombinat (Shingrix) 03/01/2022    TDAP  status: n/a  Flu Vaccine status: Up to date  Pneumococcal vaccine status: Up to date  Covid-19 vaccine status: Completed vaccines  Qualifies for Shingles Vaccine? Yes   Zostavax completed No   Shingrix Completed?: needs second dose  Screening Tests Health Maintenance  Topic Date Due   Zoster Vaccines- Shingrix (2 of 2) 04/26/2022   Medicare Annual Wellness (AWV)  05/02/2022   Pneumonia Vaccine 26+ Years old  Completed   INFLUENZA VACCINE  Completed   DEXA SCAN  Completed   COVID-19 Vaccine  Completed   HPV VACCINES  Aged Out   DTaP/Tdap/Td  Discontinued    Health Maintenance  Health Maintenance Due  Topic Date Due   Zoster Vaccines- Shingrix (2 of 2) 04/26/2022   Medicare Annual Wellness (AWV)  05/02/2022    Colorectal cancer screening: No longer required.   Mammogram status: No longer required due to age.  Bone Density status: Completed 09/04/2020.   Lung Cancer Screening: (Low Dose CT Chest recommended if Age 13-80 years, 30 pack-year currently smoking OR have quit w/in 15years.) does not qualify.   Lung Cancer Screening Referral: no  Additional Screening:  Hepatitis C Screening: does not qualify;   Vision Screening: Recommended annual ophthalmology exams for early detection of glaucoma and other disorders of the eye. Is the patient up to date with their annual eye exam?  Yes  Who is the provider or what is the name of the office in which the patient attends annual eye exams? [facility] If pt is not established with a provider, would they like to be referred to a provider to establish care? No .   Dental Screening: Recommended annual dental exams for proper oral hygiene  Community Resource Referral / Chronic Care Management: CRR required this visit?  No   CCM required this visit?  No      Plan:     I have personally reviewed and noted the following in the patient's chart:   Medical and  social history Use of alcohol, tobacco or illicit drugs  Current  medications and supplements including opioid prescriptions. Patient is not currently taking opioid prescriptions. Functional ability and status Nutritional status Physical activity Advanced directives List of other physicians Hospitalizations, surgeries, and ER visits in previous 12 months Vitals Screenings to include cognitive, depression, and falls Referrals and appointments  In addition, I have reviewed and discussed with patient certain preventive protocols, quality metrics, and best practice recommendations. A written personalized care plan for preventive services as well as general preventive health recommendations were provided to patient.     Kellie Simmering, LPN   [id number]   Nurse Notes: none  Due to this being a virtual visit, the after visit summary with patients personalized plan was offered to patient via mail or my-chart. to pick up at office at next visit

## 2022-05-13 NOTE — Patient Instructions (Signed)
Ruth Nelson , Thank you for taking time to come for your Medicare Wellness Visit. I appreciate your ongoing commitment to your health goals. Please review the following plan we discussed and let me know if I can assist you in the future.   These are the goals we discussed:  Goals      Patient Stated     Increase activity & drink more water     Patient Stated     05/13/2022, no goals        This is a list of the screening recommended for you and due dates:  Health Maintenance  Topic Date Due   Zoster (Shingles) Vaccine (2 of 2) 04/26/2022   Medicare Annual Wellness Visit  05/13/2023   Pneumonia Vaccine  Completed   Flu Shot  Completed   DEXA scan (bone density measurement)  Completed   COVID-19 Vaccine  Completed   HPV Vaccine  Aged Out   DTaP/Tdap/Td vaccine  Discontinued    Advanced directives: Advance directive discussed with you today.   Conditions/risks identified: none  Next appointment: Follow up in one year for your annual wellness visit    Preventive Care 65 Years and Older, Female Preventive care refers to lifestyle choices and visits with your health care provider that can promote health and wellness. What does preventive care include? A yearly physical exam. This is also called an annual well check. Dental exams once or twice a year. Routine eye exams. Ask your health care provider how often you should have your eyes checked. Personal lifestyle choices, including: Daily care of your teeth and gums. Regular physical activity. Eating a healthy diet. Avoiding tobacco and drug use. Limiting alcohol use. Practicing safe sex. Taking low-dose aspirin every day. Taking vitamin and mineral supplements as recommended by your health care provider. What happens during an annual well check? The services and screenings done by your health care provider during your annual well check will depend on your age, overall health, lifestyle risk factors, and family history of  disease. Counseling  Your health care provider may ask you questions about your: Alcohol use. Tobacco use. Drug use. Emotional well-being. Home and relationship well-being. Sexual activity. Eating habits. History of falls. Memory and ability to understand (cognition). Work and work Statistician. Reproductive health. Screening  You may have the following tests or measurements: Height, weight, and BMI. Blood pressure. Lipid and cholesterol levels. These may be checked every 5 years, or more frequently if you are over 52 years old. Skin check. Lung cancer screening. You may have this screening every year starting at age 98 if you have a 30-pack-year history of smoking and currently smoke or have quit within the past 15 years. Fecal occult blood test (FOBT) of the stool. You may have this test every year starting at age 71. Flexible sigmoidoscopy or colonoscopy. You may have a sigmoidoscopy every 5 years or a colonoscopy every 10 years starting at age 54. Hepatitis C blood test. Hepatitis B blood test. Sexually transmitted disease (STD) testing. Diabetes screening. This is done by checking your blood sugar (glucose) after you have not eaten for a while (fasting). You may have this done every 1-3 years. Bone density scan. This is done to screen for osteoporosis. You may have this done starting at age 69. Mammogram. This may be done every 1-2 years. Talk to your health care provider about how often you should have regular mammograms. Talk with your health care provider about your test results, treatment options, and  if necessary, the need for more tests. Vaccines  Your health care provider may recommend certain vaccines, such as: Influenza vaccine. This is recommended every year. Tetanus, diphtheria, and acellular pertussis (Tdap, Td) vaccine. You may need a Td booster every 10 years. Zoster vaccine. You may need this after age 30. Pneumococcal 13-valent conjugate (PCV13) vaccine. One  dose is recommended after age 36. Pneumococcal polysaccharide (PPSV23) vaccine. One dose is recommended after age 79. Talk to your health care provider about which screenings and vaccines you need and how often you need them. This information is not intended to replace advice given to you by your health care provider. Make sure you discuss any questions you have with your health care provider. Document Released: 03/10/2015 Document Revised: 11/01/2015 Document Reviewed: 12/13/2014 Elsevier Interactive Patient Education  2017 [place] Prevention in the Home Falls can cause injuries. They can happen to people of all ages. There are many things you can do to make your home safe and to help prevent falls. What can I do on the outside of my home? Regularly fix the edges of walkways and driveways and fix any cracks. Remove anything that might make you trip as you walk through a door, such as a raised step or threshold. Trim any bushes or trees on the path to your home. Use bright outdoor lighting. Clear any walking paths of anything that might make someone trip, such as rocks or tools. Regularly check to see if handrails are loose or broken. Make sure that both sides of any steps have handrails. Any raised decks and porches should have guardrails on the edges. Have any leaves, snow, or ice cleared regularly. Use sand or salt on walking paths during winter. Clean up any spills in your garage right away. This includes oil or grease spills. What can I do in the bathroom? Use night lights. Install grab bars by the toilet and in the tub and shower. Do not use towel bars as grab bars. Use non-skid mats or decals in the tub or shower. If you need to sit down in the shower, use a plastic, non-slip stool. Keep the floor dry. Clean up any water that spills on the floor as soon as it happens. Remove soap buildup in the tub or shower regularly. Attach bath mats securely with double-sided  non-slip rug tape. Do not have throw rugs and other things on the floor that can make you trip. What can I do in the bedroom? Use night lights. Make sure that you have a light by your bed that is easy to reach. Do not use any sheets or blankets that are too big for your bed. They should not hang down onto the floor. Have a firm chair that has side arms. You can use this for support while you get dressed. Do not have throw rugs and other things on the floor that can make you trip. What can I do in the kitchen? Clean up any spills right away. Avoid walking on wet floors. Keep items that you use a lot in easy-to-reach places. If you need to reach something above you, use a strong step stool that has a grab bar. Keep electrical cords out of the way. Do not use floor polish or wax that makes floors slippery. If you must use wax, use non-skid floor wax. Do not have throw rugs and other things on the floor that can make you trip. What can I do with my stairs? Do not leave any  items on the stairs. Make sure that there are handrails on both sides of the stairs and use them. Fix handrails that are broken or loose. Make sure that handrails are as long as the stairways. Check any carpeting to make sure that it is firmly attached to the stairs. Fix any carpet that is loose or worn. Avoid having throw rugs at the top or bottom of the stairs. If you do have throw rugs, attach them to the floor with carpet tape. Make sure that you have a light switch at the top of the stairs and the bottom of the stairs. If you do not have them, ask someone to add them for you. What else can I do to help prevent falls? Wear shoes that: Do not have high heels. Have rubber bottoms. Are comfortable and fit you well. Are closed at the toe. Do not wear sandals. If you use a stepladder: Make sure that it is fully opened. Do not climb a closed stepladder. Make sure that both sides of the stepladder are locked into place. Ask  someone to hold it for you, if possible. Clearly mark and make sure that you can see: Any grab bars or handrails. First and last steps. Where the edge of each step is. Use tools that help you move around (mobility aids) if they are needed. These include: Canes. Walkers. Scooters. Crutches. Turn on the lights when you go into a dark area. Replace any light bulbs as soon as they burn out. Set up your furniture so you have a clear path. Avoid moving your furniture around. If any of your floors are uneven, fix them. If there are any pets around you, be aware of where they are. Review your medicines with your doctor. Some medicines can make you feel dizzy. This can increase your chance of falling. Ask your doctor what other things that you can do to help prevent falls. This information is not intended to replace advice given to you by your health care provider. Make sure you discuss any questions you have with your health care provider. Document Released: 12/08/2008 Document Revised: 07/20/2015 Document Reviewed: 03/18/2014 Elsevier Interactive Patient Education  2017 [employer].

## 2022-05-20 ENCOUNTER — Other Ambulatory Visit: Payer: Medicare HMO

## 2022-05-31 ENCOUNTER — Other Ambulatory Visit: Payer: Medicare HMO

## 2022-06-06 ENCOUNTER — Telehealth: Payer: Self-pay

## 2022-06-06 NOTE — Progress Notes (Signed)
Care Management & Coordination Services Pharmacy Team  Reason for Encounter: Hypertension  Contacted patient to discuss hypertension disease state.  Unsuccessful outreach. Left voicemail for patient to return call.    Current antihypertensive regimen:  Amlodipine 5 mg daily     What recent interventions/DTPs have been made by any provider to improve Blood Pressure control since last CPP Visit:  04/24/2022 Ruth Penna NP (PCP) Increase amlodipine to 5mg  daily  Any recent hospitalizations or ED visits since last visit with CPP? No   Adherence Review: Is the patient currently on ACE/ARB medication? No Does the patient have >5 day gap between last estimated fill dates? No  Care Gaps: COVID-19 Vaccine   Star Rating Drugs: None ID  Chart Updates: Recent office visits:  05/13/2022 Ruth Ponder LPN (PCP Office) Medicare Wellness completed, No medication changes noted 04/24/2022 Ruth Penna NP (PCP) Increase amlodipine to 5mg  daily, Increased levothyroxine dose to 50 mcg, Stop lactulose, Switch  Dulcolax to miralax 17g daily or colace 1tab BID, Return in about 4 weeks     Recent consult visits:  None ID  Hospital visits:  None in previous 6 months  Medications: Outpatient Encounter Medications as of 06/06/2022  Medication Sig   amLODipine (NORVASC) 5 MG tablet Take 1 tablet (5 mg total) by mouth daily.   aspirin EC 81 MG tablet Take 81 mg by mouth daily. Swallow whole.   bisacodyl (DULCOLAX) 5 MG EC tablet Take 5 mg by mouth daily as needed for moderate constipation.   citalopram (CELEXA) 10 MG tablet TAKE 1 TABLET BY MOUTH EVERY DAY   levothyroxine (SYNTHROID) 50 MCG tablet Take 1 tablet (50 mcg total) by mouth daily before breakfast.   Multiple Vitamin (ONE-A-DAY ESSENTIAL PO) Take by mouth. Women's One-A-Day   No facility-administered encounter medications on file as of 06/06/2022.    Recent Office Vitals: BP Readings from Last 3 Encounters:  04/24/22 (!) 140/72   09/25/21 (!) 112/52  05/01/21 138/64   Pulse Readings from Last 3 Encounters:  04/24/22 95  09/25/21 94  05/01/21 81    Wt Readings from Last 3 Encounters:  05/13/22 110 lb (49.9 kg)  04/24/22 112 lb (50.8 kg)  09/25/21 111 lb (50.3 kg)     Kidney Function Lab Results  Component Value Date/Time   CREATININE 1.90 (H) 04/24/2022 09:23 AM   CREATININE 1.63 (H) 09/25/2021 10:41 AM   GFR 24.31 (L) 04/24/2022 09:23 AM       Latest Ref Rng & Units 04/24/2022    9:23 AM 09/25/2021   10:41 AM 01/23/2021   12:09 PM  BMP  Glucose 70 - 99 mg/dL 79  81  85   BUN 6 - 23 mg/dL 27  15  14    Creatinine 0.40 - 1.20 mg/dL 2.02  5.42  7.06   Sodium 135 - 145 mEq/L 144  140  137   Potassium 3.5 - 5.1 mEq/L 5.8 No hemolysis seen  5.3  4.4   Chloride 96 - 112 mEq/L 110  105  102   CO2 19 - 32 mEq/L 27  28  26    Calcium 8.4 - 10.5 mg/dL 9.4  9.5  9.7      [facility] Clinical Pharmacist Assistant [phone]

## 2022-06-26 ENCOUNTER — Inpatient Hospital Stay: Admission: RE | Admit: 2022-06-26 | Payer: Medicare HMO | Source: Ambulatory Visit

## 2022-07-21 ENCOUNTER — Other Ambulatory Visit: Payer: Self-pay | Admitting: Nurse Practitioner

## 2022-07-21 DIAGNOSIS — R4689 Other symptoms and signs involving appearance and behavior: Secondary | ICD-10-CM

## 2022-07-26 ENCOUNTER — Ambulatory Visit
Admission: RE | Admit: 2022-07-26 | Discharge: 2022-07-26 | Disposition: A | Discharge status: Home / Self Care | Payer: Medicare HMO | Source: Ambulatory Visit | Attending: Nurse Practitioner | Admitting: Nurse Practitioner

## 2022-07-26 ENCOUNTER — Other Ambulatory Visit: Payer: Self-pay | Admitting: Nurse Practitioner

## 2022-07-26 DIAGNOSIS — I10 Essential (primary) hypertension: Secondary | ICD-10-CM

## 2022-07-26 DIAGNOSIS — N184 Chronic kidney disease, stage 4 (severe): Secondary | ICD-10-CM

## 2022-07-26 DIAGNOSIS — F819 Developmental disorder of scholastic skills, unspecified: Secondary | ICD-10-CM

## 2022-07-26 DIAGNOSIS — E038 Other specified hypothyroidism: Secondary | ICD-10-CM

## 2022-07-26 DIAGNOSIS — N1330 Unspecified hydronephrosis: Secondary | ICD-10-CM

## 2022-07-26 DIAGNOSIS — N189 Chronic kidney disease, unspecified: Secondary | ICD-10-CM | POA: Diagnosis not present

## 2022-07-26 DIAGNOSIS — R051 Acute cough: Secondary | ICD-10-CM

## 2022-07-29 NOTE — Telephone Encounter (Signed)
Pt is needing a refill on this script, she did make an appt with Claris Gower for tomorrow. 07/30/22.

## 2022-07-30 ENCOUNTER — Ambulatory Visit: Payer: Medicare HMO | Admitting: Nurse Practitioner

## 2022-07-30 ENCOUNTER — Telehealth: Payer: Self-pay | Admitting: Nurse Practitioner

## 2022-07-30 NOTE — Telephone Encounter (Signed)
1st missed visit in 12 months -fee waived

## 2022-07-30 NOTE — Telephone Encounter (Signed)
6.4.24 no show letter sent 

## 2022-07-30 NOTE — Telephone Encounter (Signed)
Pt called to cancel at 12:00 6/4 said trouble with vehicle. Letter sent

## 2022-08-06 ENCOUNTER — Ambulatory Visit (INDEPENDENT_AMBULATORY_CARE_PROVIDER_SITE_OTHER): Payer: Medicare HMO | Admitting: Nurse Practitioner

## 2022-08-06 ENCOUNTER — Encounter: Payer: Self-pay | Admitting: Nurse Practitioner

## 2022-08-06 VITALS — BP 120/82 | HR 58 | Temp 97.9°F | Resp 16 | Ht 60.0 in | Wt 104.4 lb

## 2022-08-06 DIAGNOSIS — I10 Essential (primary) hypertension: Secondary | ICD-10-CM | POA: Diagnosis not present

## 2022-08-06 DIAGNOSIS — E038 Other specified hypothyroidism: Secondary | ICD-10-CM

## 2022-08-06 DIAGNOSIS — F0390 Unspecified dementia without behavioral disturbance: Secondary | ICD-10-CM | POA: Diagnosis not present

## 2022-08-06 DIAGNOSIS — N184 Chronic kidney disease, stage 4 (severe): Secondary | ICD-10-CM | POA: Diagnosis not present

## 2022-08-06 DIAGNOSIS — R600 Localized edema: Secondary | ICD-10-CM

## 2022-08-06 DIAGNOSIS — E875 Hyperkalemia: Secondary | ICD-10-CM | POA: Diagnosis not present

## 2022-08-06 DIAGNOSIS — F03918 Unspecified dementia, unspecified severity, with other behavioral disturbance: Secondary | ICD-10-CM | POA: Insufficient documentation

## 2022-08-06 LAB — RENAL FUNCTION PANEL
Albumin: 4.2 g/dL (ref 3.5–5.2)
BUN: 17 mg/dL (ref 6–23)
CO2: 25 mEq/L (ref 19–32)
Calcium: 10 mg/dL (ref 8.4–10.5)
Chloride: 108 mEq/L (ref 96–112)
Creatinine, Ser: 1.63 mg/dL — ABNORMAL HIGH (ref 0.40–1.20)
GFR: 29.16 mL/min — ABNORMAL LOW (ref >60.00)
Glucose, Bld: 93 mg/dL (ref 70–99)
Phosphorus: 3.6 mg/dL (ref 2.3–4.6)
Potassium: 6 mEq/L — ABNORMAL HIGH (ref 3.5–5.1)
Sodium: 140 mEq/L (ref 135–145)

## 2022-08-06 LAB — MICROALBUMIN / CREATININE URINE RATIO
Creatinine,U: 53.8 mg/dL
Microalb Creat Ratio: 1.3 mg/g (ref 0.0–30.0)
Microalb, Ur: <0.7 mg/dL (ref 0.0–1.9)

## 2022-08-06 LAB — T4, FREE: Free T4: 1.11 ng/dL (ref 0.60–1.60)

## 2022-08-06 LAB — URINALYSIS, MICROSCOPIC ONLY: RBC / HPF: NONE SEEN (ref 0–?)

## 2022-08-06 LAB — TSH: TSH: 1.15 u[IU]/mL (ref 0.35–5.50)

## 2022-08-06 NOTE — Assessment & Plan Note (Signed)
Renal US: Moderate left hydronephrosis.The kidneys are small bilaterally.The bladder is unremarkable. CT renal pending Pending appointment with nephrology Check urine microalbumin and urine microscopy Repeat BMP

## 2022-08-06 NOTE — Assessment & Plan Note (Signed)
[name] reports LE edema last week. Resolved spontaneously. Ruth Nelson denies any leg pain or SOB or CP BP Readings from Last 3 Encounters:  08/06/22 120/82  04/24/22 (!) 140/72  09/25/21 (!) 112/52    Advised Ruth Nelson to administer amlodipine in PM and maintain adequate oral hydration Repeat BMP today

## 2022-08-06 NOTE — Patient Instructions (Signed)
Go to lab Take amlodipine in PM Maintain adequate oral hydration: 60-64oz daily

## 2022-08-06 NOTE — Progress Notes (Signed)
Established Patient Visit  Patient: Ruth Nelson   DOB: Jul 29, 1940   82 y.o. Female  MRN: [id number] Visit Date: 08/06/2022  Subjective:    Chief Complaint  Patient presents with   Edema    Per her sister  her feet have been swelling. Noticed a week ago.     HPI Accompanied by [name]  CKD (chronic kidney disease) stage 4, GFR 15-29 ml/min (HCC) Renal US: Moderate left hydronephrosis.The kidneys are small bilaterally.The bladder is unremarkable. CT renal pending Pending appointment with nephrology Check urine microalbumin and urine microscopy Repeat BMP  Dementia without behavioral disturbance (HCC) Impaired short term memory, able to perform Adls with supervision Home with sister and brother in law Also attends adult day care center Mon-Fri. Sister-HCPOA denies any mood disturbance Current use of celexa  Other specified hypothyroidism Repeat TSh and T4 Maintain levothyroxine dose  HTN (hypertension), benign [name] reports LE edema last week. Resolved spontaneously. Marieliz denies any leg pain or SOB or CP BP Readings from Last 3 Encounters:  08/06/22 120/82  04/24/22 (!) 140/72  09/25/21 (!) 112/52    Advised [name] to administer amlodipine in PM and maintain adequate oral hydration Repeat BMP today  Reviewed medical, surgical, and social history today  Medications: Outpatient Medications Prior to Visit  Medication Sig   PREVNAR 20 0.5 ML injection    SHINGRIX injection    amLODipine (NORVASC) 5 MG tablet Take 1 tablet (5 mg total) by mouth daily.   aspirin EC 81 MG tablet Take 81 mg by mouth daily. Swallow whole.   bisacodyl (DULCOLAX) 5 MG EC tablet Take 5 mg by mouth daily as needed for moderate constipation.   citalopram (CELEXA) 10 MG tablet TAKE 1 TABLET BY MOUTH EVERY DAY   levothyroxine (SYNTHROID) 50 MCG tablet Take 1 tablet (50 mcg total) by mouth daily before breakfast.   Multiple Vitamin (ONE-A-DAY ESSENTIAL PO) Take  by mouth. Women's One-A-Day   No facility-administered medications prior to visit.   Reviewed past medical and social history.   ROS per HPI above      Objective:  BP 120/82 (BP Location: Left Arm, Patient Position: Sitting, Cuff Size: Normal)   Pulse (!) 58   Temp 97.9 F (36.6 C) (Temporal)   Resp 16   Ht 5' (1.524 m)   Wt 104 lb 6.4 oz (47.4 kg)   SpO2 98%   BMI 20.39 kg/m      Physical Exam Cardiovascular:     Rate and Rhythm: Normal rate and regular rhythm.     Pulses: Normal pulses.     Heart sounds: Normal heart sounds.  Pulmonary:     Effort: Pulmonary effort is normal.     Breath sounds: Normal breath sounds.  Musculoskeletal:        General: No tenderness.     Right lower leg: No edema.     Left lower leg: No edema.  Skin:    Findings: No bruising, erythema, lesion or rash.  Neurological:     Mental Status: She is alert and oriented to person, place, and time.     No results found for any visits on 08/06/22.    Assessment & Plan:    Problem List Items Addressed This Visit       Cardiovascular and Mediastinum   HTN (hypertension), benign    [name] reports LE edema last week. Resolved spontaneously. Chrystelle denies any leg pain  or SOB or CP BP Readings from Last 3 Encounters:  08/06/22 120/82  04/24/22 (!) 140/72  09/25/21 (!) 112/52    Advised [name] to administer amlodipine in PM and maintain adequate oral hydration Repeat BMP today        Endocrine   Other specified hypothyroidism    Repeat TSh and T4 Maintain levothyroxine dose      Relevant Orders   TSH   T4, free     Nervous and Auditory   Dementia without behavioral disturbance (HCC) - Primary    Impaired short term memory, able to perform Adls with supervision Home with sister and brother in law Also attends adult day care center Mon-Fri. Sister-HCPOA denies any mood disturbance Current use of celexa        Genitourinary   CKD (chronic kidney disease) stage 4,  GFR 15-29 ml/min (HCC)    Renal US: Moderate left hydronephrosis.The kidneys are small bilaterally.The bladder is unremarkable. CT renal pending Pending appointment with nephrology Check urine microalbumin and urine microscopy Repeat BMP      Relevant Orders   Renal Function Panel   Urine Microscopic Only   Microalbumin / creatinine urine ratio   Other Visit Diagnoses     Leg edema          Return in about 6 months (around 02/05/2023) for HTN, Hypothyroidism, hyperlipidemia (fasting).     Alysia Penna, NP

## 2022-08-06 NOTE — Assessment & Plan Note (Addendum)
Impaired short term memory, able to perform Adls with supervision Home with sister and brother in law Also attends adult day care center Mon-Fri. Sister-HCPOA denies any mood disturbance Current use of celexa

## 2022-08-06 NOTE — Assessment & Plan Note (Signed)
Repeat TSh and T4 Maintain levothyroxine dose 

## 2022-08-07 NOTE — Addendum Note (Signed)
Addended by: Michaela Corner on: 08/07/2022 08:19 AM   Modules accepted: Orders

## 2022-08-15 ENCOUNTER — Ambulatory Visit
Admission: RE | Admit: 2022-08-15 | Discharge: 2022-08-15 | Disposition: A | Discharge status: Home / Self Care | Payer: Medicare HMO | Source: Ambulatory Visit | Attending: Nurse Practitioner | Admitting: Nurse Practitioner

## 2022-08-15 DIAGNOSIS — N1330 Unspecified hydronephrosis: Secondary | ICD-10-CM | POA: Diagnosis not present

## 2022-08-15 DIAGNOSIS — N184 Chronic kidney disease, stage 4 (severe): Secondary | ICD-10-CM

## 2022-08-15 DIAGNOSIS — I10 Essential (primary) hypertension: Secondary | ICD-10-CM

## 2022-08-22 ENCOUNTER — Other Ambulatory Visit (INDEPENDENT_AMBULATORY_CARE_PROVIDER_SITE_OTHER): Payer: Medicare HMO

## 2022-08-22 DIAGNOSIS — N184 Chronic kidney disease, stage 4 (severe): Secondary | ICD-10-CM | POA: Diagnosis not present

## 2022-08-22 DIAGNOSIS — E875 Hyperkalemia: Secondary | ICD-10-CM | POA: Diagnosis not present

## 2022-08-22 LAB — BASIC METABOLIC PANEL
BUN: 22 mg/dL (ref 6–23)
CO2: 30 mEq/L (ref 19–32)
Calcium: 9.8 mg/dL (ref 8.4–10.5)
Chloride: 104 mEq/L (ref 96–112)
Creatinine, Ser: 1.68 mg/dL — ABNORMAL HIGH (ref 0.40–1.20)
GFR: 28.11 mL/min — ABNORMAL LOW (ref >60.00)
Glucose, Bld: 77 mg/dL (ref 70–99)
Potassium: 4.9 mEq/L (ref 3.5–5.1)
Sodium: 141 mEq/L (ref 135–145)

## 2022-08-30 ENCOUNTER — Other Ambulatory Visit: Payer: Medicare HMO

## 2022-09-03 ENCOUNTER — Telehealth: Payer: Self-pay | Admitting: Nurse Practitioner

## 2022-09-03 ENCOUNTER — Other Ambulatory Visit: Payer: Medicare HMO

## 2022-09-03 NOTE — Telephone Encounter (Signed)
Called and spoke to [place] (sister) and gave her [facility] telephone number to call and make appointment.  [phone]

## 2022-09-03 NOTE — Telephone Encounter (Signed)
Pt would like a referral to a kidney specialist. Does she need an appt? She is anxious and would like to move this along.

## 2022-09-06 DIAGNOSIS — I129 Hypertensive chronic kidney disease with stage 1 through stage 4 chronic kidney disease, or unspecified chronic kidney disease: Secondary | ICD-10-CM | POA: Diagnosis not present

## 2022-09-06 DIAGNOSIS — D631 Anemia in chronic kidney disease: Secondary | ICD-10-CM | POA: Diagnosis not present

## 2022-09-06 DIAGNOSIS — N184 Chronic kidney disease, stage 4 (severe): Secondary | ICD-10-CM | POA: Diagnosis not present

## 2022-09-06 DIAGNOSIS — N2581 Secondary hyperparathyroidism of renal origin: Secondary | ICD-10-CM | POA: Diagnosis not present

## 2022-09-06 DIAGNOSIS — N1330 Unspecified hydronephrosis: Secondary | ICD-10-CM | POA: Diagnosis not present

## 2022-09-10 ENCOUNTER — Encounter: Payer: Self-pay | Admitting: Nurse Practitioner

## 2022-09-10 ENCOUNTER — Encounter: Payer: Self-pay | Admitting: Pharmacist

## 2022-09-10 ENCOUNTER — Ambulatory Visit: Payer: Medicare HMO | Admitting: Nurse Practitioner

## 2022-09-10 VITALS — BP 130/78 | HR 76 | Temp 98.0°F | Resp 14 | Ht 60.0 in | Wt 104.0 lb

## 2022-09-10 DIAGNOSIS — R455 Hostility: Secondary | ICD-10-CM | POA: Diagnosis not present

## 2022-09-10 DIAGNOSIS — Z111 Encounter for screening for respiratory tuberculosis: Secondary | ICD-10-CM

## 2022-09-10 DIAGNOSIS — F03918 Unspecified dementia, unspecified severity, with other behavioral disturbance: Secondary | ICD-10-CM | POA: Diagnosis not present

## 2022-09-10 DIAGNOSIS — N184 Chronic kidney disease, stage 4 (severe): Secondary | ICD-10-CM | POA: Diagnosis not present

## 2022-09-10 DIAGNOSIS — R4689 Other symptoms and signs involving appearance and behavior: Secondary | ICD-10-CM

## 2022-09-10 MED ORDER — CITALOPRAM HYDROBROMIDE 20 MG PO TABS: 20.0000 mg | ORAL_TABLET | Freq: Every day | ORAL | 0 refills | Status: DC

## 2022-09-10 NOTE — Progress Notes (Signed)
Established Patient Visit  Patient: Ruth Nelson   DOB: 1941/01/27   82 y.o. Female  MRN: [id number] Visit Date: 09/10/2022  Subjective:    Chief Complaint  Patient presents with   Medication Refill    He sister is requesting medication for Aggression. She is having issues at the day care center   Dementia with behavioral disturbance [facility]) Sister Ruth Nelson reports frequent incidence of verbal outburst while at daycare facility. This is directed at other residents.This is usually triggered by change in routine. She attempts to instruct other residents how chores/activities should be completed. She occasionally walks over to other resident's table. Ruth Nelson denies any reported physical aggression. She starts caregivers at facility provide redirection but state incidences are getting more frequent. Today Ruth Nelson appears well, no sign of distress, dressed appropriate for season, she denies any pain, Ruth Nelson denies any change in sleep or eating or tioletting behavior or hallucination. Ruth Nelson agreed to increase celexa to 20mg  F/up in 36month   CKD (chronic kidney disease) stage 4, GFR 15-29 ml/min (HCC) She had appointment with Nephrology: recommended avoid soda and low salt diet. Has f/up appointment with urology due to left hydronephrosis  Wt Readings from Last 3 Encounters:  09/10/22 104 lb (47.2 kg)  08/06/22 104 lb 6.4 oz (47.4 kg)  05/13/22 110 lb (49.9 kg)    BP Readings from Last 3 Encounters:  09/10/22 130/78  08/06/22 120/82  04/24/22 (!) 140/72    She needs form completed for respite care facility. TB screen required.  Reviewed medical, surgical, and social history today  Medications: Outpatient Medications Prior to Visit  Medication Sig   amLODipine (NORVASC) 5 MG tablet Take 1 tablet (5 mg total) by mouth daily.   aspirin EC 81 MG tablet Take 81 mg by mouth daily. Swallow whole.   bisacodyl (DULCOLAX) 5 MG EC tablet Take 5 mg by mouth daily as needed for  moderate constipation.   levothyroxine (SYNTHROID) 50 MCG tablet Take 1 tablet (50 mcg total) by mouth daily before breakfast.   Multiple Vitamin (ONE-A-DAY ESSENTIAL PO) Take by mouth. Women's One-A-Day   PREVNAR 20 0.5 ML injection    SHINGRIX injection    [DISCONTINUED] citalopram (CELEXA) 10 MG tablet TAKE 1 TABLET BY MOUTH EVERY DAY   No facility-administered medications prior to visit.   Reviewed past medical and social history.   ROS per HPI above  Last CBC Lab Results  Component Value Date   WBC 4.4 09/25/2021   HGB 14.4 09/25/2021   HCT 45.1 09/25/2021   MCV 91.8 09/25/2021   RDW 12.7 09/25/2021   PLT 299.0 09/25/2021   Last metabolic panel Lab Results  Component Value Date   GLUCOSE 77 08/22/2022   NA 141 08/22/2022   K 4.9 08/22/2022   CL 104 08/22/2022   CO2 30 08/22/2022   BUN 22 08/22/2022   CREATININE 1.68 (H) 08/22/2022   GFR 28.11 (L) 08/22/2022   CALCIUM 9.8 08/22/2022   PHOS 3.6 08/06/2022   PROT 7.7 09/25/2021   ALBUMIN 4.2 08/06/2022   BILITOT 0.3 09/25/2021   ALKPHOS 51 09/25/2021   AST 19 09/25/2021   ALT 14 09/25/2021   Last hemoglobin A1c No results found for: "HGBA1C" Last thyroid functions Lab Results  Component Value Date   TSH 1.15 08/06/2022   T4TOTAL 8.2 02/17/2020      Objective:  BP 130/78 (BP Location: Left Arm, Patient Position: Sitting,  Cuff Size: Normal)   Pulse 76   Temp 98 F (36.7 C)   Resp 14   Ht 5' (1.524 m)   Wt 104 lb (47.2 kg)   SpO2 100%   BMI 20.31 kg/m      Physical Exam Vitals reviewed.  Cardiovascular:     Rate and Rhythm: Normal rate.     Pulses: Normal pulses.  Pulmonary:     Effort: Pulmonary effort is normal.  Musculoskeletal:     Right lower leg: No edema.     Left lower leg: No edema.  Neurological:     Mental Status: She is alert.     Comments: Oriented to person, and family  Psychiatric:        Mood and Affect: Mood normal.        Behavior: Behavior normal.     No results  found for any visits on 09/10/22.    Assessment & Plan:    Problem List Items Addressed This Visit       Nervous and Auditory   Dementia with behavioral disturbance (HCC)    Sister Ruth Nelson reports frequent incidence of verbal outburst while at daycare facility. This is directed at other residents.This is usually triggered by change in routine. She attempts to instruct other residents how chores/activities should be completed. She occasionally walks over to other resident's table. Ruth Nelson denies any reported physical aggression. She starts caregivers at facility provide redirection but state incidences are getting more frequent. Today Ruth Nelson appears well, no sign of distress, dressed appropriate for season, she denies any pain, Ruth Nelson denies any change in sleep or eating or tioletting behavior or hallucination. Ruth Nelson agreed to increase celexa to 20mg  F/up in 3month       Relevant Medications   citalopram (CELEXA) 20 MG tablet     Genitourinary   CKD (chronic kidney disease) stage 4, GFR 15-29 ml/min (HCC)    She had appointment with Nephrology: recommended avoid soda and low salt diet. Has f/up appointment with urology due to left hydronephrosis      Other Visit Diagnoses     Screening-pulmonary TB    -  Primary   Relevant Orders   DG Chest 2 View   Aggressive behavior of adult       Relevant Medications   citalopram (CELEXA) 20 MG tablet      Return in about 4 weeks (around 10/08/2022) for mood disorder.     Alysia Penna, NP

## 2022-09-10 NOTE — Patient Instructions (Signed)
Go to [address] for CXR Will complete and mail form once I get CXR results  Increase celexa to 20mg  daily. F/up in 75month

## 2022-09-10 NOTE — Progress Notes (Signed)
Patient previously followed by [facility] pharmacist. Per clinical review, no pharmacist appointment needed at this time. Care guide directed to contact patient and cancel appointment and notify pharmacy team of any patient concerns.  

## 2022-09-10 NOTE — Assessment & Plan Note (Addendum)
She had appointment with Nephrology: recommended avoid soda and low salt diet. Has f/up appointment with urology due to left hydronephrosis

## 2022-09-10 NOTE — Assessment & Plan Note (Addendum)
Sister Unk Pinto reports frequent incidence of verbal outburst while at daycare facility. This is directed at other residents.This is usually triggered by change in routine. She attempts to instruct other residents how chores/activities should be completed. She occasionally walks over to other resident's table. Ruth Nelson denies any reported physical aggression. She starts caregivers at facility provide redirection but state incidences are getting more frequent. Today Odelia appears well, no sign of distress, dressed appropriate for season, she denies any pain, Ruth Nelson denies any change in sleep or eating or tioletting behavior or hallucination. Ruth Nelson agreed to increase celexa to 20mg  F/up in 39month

## 2022-09-13 ENCOUNTER — Other Ambulatory Visit: Payer: Self-pay | Admitting: Nephrology

## 2022-09-13 DIAGNOSIS — N1330 Unspecified hydronephrosis: Secondary | ICD-10-CM

## 2022-09-30 ENCOUNTER — Ambulatory Visit (INDEPENDENT_AMBULATORY_CARE_PROVIDER_SITE_OTHER): Payer: Medicare HMO | Admitting: Internal Medicine

## 2022-09-30 ENCOUNTER — Ambulatory Visit
Admission: RE | Admit: 2022-09-30 | Discharge: 2022-09-30 | Disposition: A | Discharge status: Home / Self Care | Payer: Medicare HMO | Source: Ambulatory Visit | Attending: Nephrology | Admitting: Nephrology

## 2022-09-30 ENCOUNTER — Encounter: Payer: Self-pay | Admitting: Internal Medicine

## 2022-09-30 ENCOUNTER — Ambulatory Visit: Payer: Medicare HMO | Admitting: Nurse Practitioner

## 2022-09-30 ENCOUNTER — Other Ambulatory Visit: Payer: Medicare HMO

## 2022-09-30 VITALS — BP 122/74 | HR 76 | Temp 98.2°F | Ht 60.0 in | Wt 102.6 lb

## 2022-09-30 DIAGNOSIS — N132 Hydronephrosis with renal and ureteral calculous obstruction: Secondary | ICD-10-CM | POA: Diagnosis not present

## 2022-09-30 DIAGNOSIS — M7989 Other specified soft tissue disorders: Secondary | ICD-10-CM

## 2022-09-30 DIAGNOSIS — I700 Atherosclerosis of aorta: Secondary | ICD-10-CM | POA: Diagnosis not present

## 2022-09-30 DIAGNOSIS — N1330 Unspecified hydronephrosis: Secondary | ICD-10-CM

## 2022-09-30 NOTE — Progress Notes (Signed)
[facility] [address] [place] [address] Dept: [phone] Dept Fax: [phone]  Acute Care Office Visit  Subjective:   Ruth Nelson 11-27-1940 09/30/2022  Chief Complaint  Patient presents with   Joint Swelling    Started a July,  forgot to change med      HPI: Ruth Nelson is a 82 yo F who presents with her sister Unk Pinto for complaint of LE swelling. Reports swelling started in July.  Swelling fluctuates day-to-day.  Most recently noted swelling last week.  Patient recently saw Claris Gower NP, and was told to change amlodipine to nighttime due to swelling, but patient and her sister both forgot to do this. Denies injury or warmth to extremity.         The following portions of the patient's history were reviewed and updated as appropriate: past medical history, past surgical history, family history, social history, allergies, medications, and problem list.   Patient Active Problem List   Diagnosis Date Noted   Dementia with behavioral disturbance (HCC) 08/06/2022   Mental developmental delay 09/25/2021   Age related osteoporosis 01/23/2021   HTN (hypertension), benign 05/02/2020   Other specified hypothyroidism 05/02/2020   CKD (chronic kidney disease) stage 4, GFR 15-29 ml/min (HCC) 05/02/2020   History reviewed. No pertinent past medical history. History reviewed. No pertinent surgical history. History reviewed. No pertinent family history. Outpatient Medications Prior to Visit  Medication Sig Dispense Refill   amLODipine (NORVASC) 5 MG tablet Take 1 tablet (5 mg total) by mouth daily. 90 tablet 1   aspirin EC 81 MG tablet Take 81 mg by mouth daily. Swallow whole.     bisacodyl (DULCOLAX) 5 MG EC tablet Take 5 mg by mouth daily as needed for moderate constipation.     citalopram (CELEXA) 20 MG tablet Take 1 tablet (20 mg total) by mouth daily. 90 tablet 0   levothyroxine (SYNTHROID) 50 MCG tablet  Take 1 tablet (50 mcg total) by mouth daily before breakfast. 90 tablet 1   Multiple Vitamin (ONE-A-DAY ESSENTIAL PO) Take by mouth. Women's One-A-Day     PREVNAR 20 0.5 ML injection      SHINGRIX injection      No facility-administered medications prior to visit.   No Known Allergies   ROS: A complete ROS was performed with pertinent positives/negatives noted in the HPI. The remainder of the ROS are negative.    Objective:   Today's Vitals   09/30/22 1522  BP: 122/74  Pulse: 76  Temp: 98.2 F (36.8 C)  TempSrc: Temporal  SpO2: 98%  Weight: 102 lb 9.6 oz (46.5 kg)  Height: 5' (1.524 m)    GENERAL: Well-appearing, in NAD. Well nourished.  SKIN: Pink, warm and dry. No rash. RESPIRATORY: Chest wall symmetrical. Respirations even and non-labored. Breath sounds clear to auscultation bilaterally.  CARDIAC: S1, S2 present, regular rate and rhythm. Peripheral pulses 2+ bilaterally.  MSK: Muscle tone and strength appropriate for age.Joints w/o tenderness, redness, or warmth. Minimal L. Ankle swelling.  EXTREMITIES: Without clubbing, cyanosis, or edema.  NEUROLOGIC:  Steady, even gait.  PSYCH/MENTAL STATUS: Alert. Cooperative.   No results found for any visits on 09/30/22.    Assessment & Plan:  1. Swelling of lower extremity - advised to change amlodipine to nighttime dosage.  Also advised patient can do knee-high compression socks as needed.  Concerning signs and symptoms discussed with Louie Casa, who will return to the clinic with patient if such symptoms  occur.   No orders of the defined types were placed in this encounter.  Lab Orders  No laboratory test(s) ordered today   No images are attached to the encounter or orders placed in the encounter.  Return if symptoms worsen or fail to improve.   Of note, portions of this note may have been created with voice recognition software Physicist, medical). While this note has been edited for accuracy, occasional wrong-word or  'sound-a-like' substitutions may have occurred due to the inherent limitations of voice recognition software.  Salvatore Decent, FNP

## 2022-10-07 ENCOUNTER — Telehealth: Payer: Self-pay

## 2022-10-07 ENCOUNTER — Telehealth: Payer: Self-pay | Admitting: Nurse Practitioner

## 2022-10-07 NOTE — Telephone Encounter (Signed)
Pt's sisters, Unk Pinto is needing a cb concerning a form that needs to be filled out. She says the form is here. She would like to pick it up after her sister's appointment on 10/08/22. [name] at [phone]

## 2022-10-07 NOTE — Telephone Encounter (Signed)
Called the reading room for [facility] for Chest X-ray results. Per Unk Pinto, X-ray was done 09/30/2022.  Left message to return call.

## 2022-10-07 NOTE — Telephone Encounter (Signed)
X-ray results have not been released

## 2022-10-08 ENCOUNTER — Encounter: Payer: Self-pay | Admitting: Nurse Practitioner

## 2022-10-08 ENCOUNTER — Ambulatory Visit (INDEPENDENT_AMBULATORY_CARE_PROVIDER_SITE_OTHER): Payer: Medicare HMO | Admitting: Nurse Practitioner

## 2022-10-08 ENCOUNTER — Ambulatory Visit (INDEPENDENT_AMBULATORY_CARE_PROVIDER_SITE_OTHER)
Admission: RE | Admit: 2022-10-08 | Discharge: 2022-10-08 | Disposition: A | Discharge status: Home / Self Care | Payer: Medicare HMO | Source: Ambulatory Visit | Attending: Nurse Practitioner | Admitting: Nurse Practitioner

## 2022-10-08 VITALS — BP 100/60 | HR 91 | Temp 98.0°F | Resp 16 | Ht 60.0 in | Wt 102.0 lb

## 2022-10-08 DIAGNOSIS — R4689 Other symptoms and signs involving appearance and behavior: Secondary | ICD-10-CM

## 2022-10-08 DIAGNOSIS — F03918 Unspecified dementia, unspecified severity, with other behavioral disturbance: Secondary | ICD-10-CM

## 2022-10-08 DIAGNOSIS — Z111 Encounter for screening for respiratory tuberculosis: Secondary | ICD-10-CM | POA: Diagnosis not present

## 2022-10-08 MED ORDER — CITALOPRAM HYDROBROMIDE 20 MG PO TABS
20.0000 mg | ORAL_TABLET | Freq: Every day | ORAL | 3 refills | Status: DC
Start: 2022-10-08 — End: 2023-09-05

## 2022-10-08 NOTE — Assessment & Plan Note (Signed)
[name] reports stable mood Denies any adverse side effects.  Maintain med dose F/up in 4month

## 2022-10-08 NOTE — Progress Notes (Signed)
                Established Patient Visit  Patient: Ruth Nelson   DOB: 02/02/1941   82 y.o. Female  MRN: [id number] Visit Date: 10/08/2022  Subjective:    Chief Complaint  Patient presents with   Follow-up    Mood disorder   HPI Dementia with behavioral disturbance (HCC) [name] reports stable mood Denies any adverse side effects.  Maintain med dose F/up in 35month  Reviewed medical, surgical, and social history today  Medications: Outpatient Medications Prior to Visit  Medication Sig   amLODipine (NORVASC) 5 MG tablet Take 1 tablet (5 mg total) by mouth daily.   aspirin EC 81 MG tablet Take 81 mg by mouth daily. Swallow whole.   bisacodyl (DULCOLAX) 5 MG EC tablet Take 5 mg by mouth daily as needed for moderate constipation.   levothyroxine (SYNTHROID) 50 MCG tablet Take 1 tablet (50 mcg total) by mouth daily before breakfast.   Multiple Vitamin (ONE-A-DAY ESSENTIAL PO) Take by mouth. Women's One-A-Day   PREVNAR 20 0.5 ML injection    SHINGRIX injection    [DISCONTINUED] citalopram (CELEXA) 20 MG tablet Take 1 tablet (20 mg total) by mouth daily.   No facility-administered medications prior to visit.   Reviewed past medical and social history.   ROS per HPI above      Objective:  BP 100/60 (BP Location: Left Arm, Patient Position: Sitting, Cuff Size: Normal)   Pulse 91   Temp 98 F (36.7 C)   Resp 16   Ht 5' (1.524 m)   Wt 102 lb (46.3 kg)   SpO2 98%   BMI 19.92 kg/m      Physical Exam Vitals and nursing note reviewed.  Cardiovascular:     Rate and Rhythm: Normal rate.     Pulses: Normal pulses.  Pulmonary:     Effort: Pulmonary effort is normal.  Neurological:     Mental Status: She is alert.     Comments: Oriented to person, and place. Able to recognize sister by name     No results found for any visits on 10/08/22.    Assessment & Plan:    Problem List Items Addressed This Visit       Nervous and Auditory   Dementia with  behavioral disturbance (HCC) - Primary    [name] reports stable mood Denies any adverse side effects.  Maintain med dose F/up in 35month      Relevant Medications   citalopram (CELEXA) 20 MG tablet   Other Visit Diagnoses     Aggressive behavior of adult       Relevant Medications   citalopram (CELEXA) 20 MG tablet      Return in about 3 months (around 01/08/2023) for mood disorder, HTN, Hypothyroidism, hyperlipidemia (fasting).     Ruth Penna, NP

## 2022-10-08 NOTE — Patient Instructions (Addendum)
Maintain current med doses  [address] for CXR

## 2022-10-08 NOTE — Telephone Encounter (Signed)
Called [facility] and they did not have her X-ray. I was transferred to the reading room and her Xray was not sent to them for reading.

## 2022-10-16 ENCOUNTER — Telehealth: Payer: Self-pay | Admitting: Nurse Practitioner

## 2022-10-16 NOTE — Telephone Encounter (Signed)
10/16/22 - Pt's sister, Unk Pinto called saying the nurse should pls fax the information regarding the pt to [phone]. She wants a call back at [phone]

## 2022-10-16 NOTE — Telephone Encounter (Signed)
Pts sister is requesting that the forms you so kindly mailed be faxed directly to [facility] at [phone]. She stated that she is sorry for the pressure but if they do not receive it she will not be able to attend. She added it is her fault due to she did not take care of it in a timely manner. She said thank you so much! If she needs to she will come pick them up today.

## 2022-10-17 ENCOUNTER — Telehealth: Payer: Self-pay

## 2022-10-17 ENCOUNTER — Other Ambulatory Visit: Payer: Self-pay | Admitting: Nurse Practitioner

## 2022-10-17 DIAGNOSIS — I10 Essential (primary) hypertension: Secondary | ICD-10-CM

## 2022-10-17 DIAGNOSIS — E038 Other specified hypothyroidism: Secondary | ICD-10-CM

## 2022-10-17 NOTE — Telephone Encounter (Signed)
PAPERWORK/FORMS received   CLINICAL USE BELOW THIS LINE (use X to signify action taken)  _X_ Form received and placed in providers office for signature. ___ Form completed and faxed to LOA Dept.  ___ Form completed & LVM to notify patient ready for pick up.  ___ Charge sheet and copy of form in front office folder for office supervisor.

## 2022-10-17 NOTE — Telephone Encounter (Signed)
Results faxed 8/21 by front office

## 2022-11-05 ENCOUNTER — Encounter: Payer: Self-pay | Admitting: Nurse Practitioner

## 2022-11-05 ENCOUNTER — Ambulatory Visit (INDEPENDENT_AMBULATORY_CARE_PROVIDER_SITE_OTHER): Payer: Medicare HMO | Admitting: Nurse Practitioner

## 2022-11-05 VITALS — BP 122/76 | HR 80 | Temp 97.4°F | Ht 61.0 in | Wt 102.2 lb

## 2022-11-05 DIAGNOSIS — K625 Hemorrhage of anus and rectum: Secondary | ICD-10-CM

## 2022-11-05 DIAGNOSIS — Z23 Encounter for immunization: Secondary | ICD-10-CM | POA: Diagnosis not present

## 2022-11-05 DIAGNOSIS — R197 Diarrhea, unspecified: Secondary | ICD-10-CM

## 2022-11-05 DIAGNOSIS — R634 Abnormal weight loss: Secondary | ICD-10-CM | POA: Insufficient documentation

## 2022-11-05 LAB — IFOBT (OCCULT BLOOD): IFOBT: NEGATIVE

## 2022-11-05 MED ORDER — HYDROCORTISONE (PERIANAL) 2.5 % EX CREA: 1.0000 | TOPICAL_CREAM | Freq: Two times a day (BID) | CUTANEOUS | 0 refills | Status: AC

## 2022-11-05 NOTE — Patient Instructions (Addendum)
You will be contacted to schedule appointment with GI and for CT. Use proctosol cream to as need for bulging hemorrhoid

## 2022-11-05 NOTE — Assessment & Plan Note (Addendum)
9lbs weight loss in last 1year Sister reports no change in appetite. She noted blood in commode yesterday after Ms. Hajra had a BM. She has also noticed loose stool without change in diet. No use of stool softener or laxative. Ms. Chylee denies any ABDOMEN pain or nausea or difficulty swallowing or rectal pain.  POC iFOB was negative today. Sister-Ms. Tinley agreed to CT ABDOMEN/pelvis with oral contrast only. Avoid IV contrast due to CKD stage 4. She also agreed to GI referral.

## 2022-11-05 NOTE — Progress Notes (Signed)
Established Patient Visit  Patient: Ruth Nelson   DOB: 1940/11/10   82 y.o. Female  MRN: [id number] Visit Date: 11/05/2022  Subjective:    Chief Complaint  Patient presents with   Rectal Bleeding   HPI Weight loss, unintentional 9lbs weight loss in last 1year Sister reports no change in appetite. She noted blood in commode yesterday after Ms. Rethel had a BM. She has also noticed loose stool without change in diet. Ms. Tiffine denies any ABDOMEN pain or nausea or difficulty swallowing or rectal pain.  POC iFOB was negative today. Sister-Ms. Tinley agreed to CT ABDOMEN/pelvis with oral contrast only. Avoid IV contrast due to CKD stage 4. She also agreed to GI referral.  Wt Readings from Last 3 Encounters:  11/05/22 102 lb 3.2 oz (46.4 kg)  10/08/22 102 lb (46.3 kg)  09/30/22 102 lb 9.6 oz (46.5 kg)    Reviewed medical, surgical, and social history today  Medications: Outpatient Medications Prior to Visit  Medication Sig   amLODipine (NORVASC) 5 MG tablet TAKE 1 TABLET BY MOUTH DAILY.   aspirin EC 81 MG tablet Take 81 mg by mouth daily. Swallow whole.   bisacodyl (DULCOLAX) 5 MG EC tablet Take 5 mg by mouth daily as needed for moderate constipation.   citalopram (CELEXA) 20 MG tablet Take 1 tablet (20 mg total) by mouth daily.   levothyroxine (SYNTHROID) 50 MCG tablet TAKE 1 TABLET BY MOUTH DAILY BEFORE BREAKFAST   Multiple Vitamin (ONE-A-DAY ESSENTIAL PO) Take by mouth. Women's One-A-Day   PREVNAR 20 0.5 ML injection    SHINGRIX injection    No facility-administered medications prior to visit.   Reviewed past medical and social history.   ROS per HPI above  Last CBC Lab Results  Component Value Date   WBC 4.4 09/25/2021   HGB 14.4 09/25/2021   HCT 45.1 09/25/2021   MCV 91.8 09/25/2021   RDW 12.7 09/25/2021   PLT 299.0 09/25/2021   Last metabolic panel Lab Results  Component Value Date   GLUCOSE 77 08/22/2022   NA 141 08/22/2022   K 4.9  08/22/2022   CL 104 08/22/2022   CO2 30 08/22/2022   BUN 22 08/22/2022   CREATININE 1.68 (H) 08/22/2022   GFR 28.11 (L) 08/22/2022   CALCIUM 9.8 08/22/2022   PHOS 3.6 08/06/2022   PROT 7.7 09/25/2021   ALBUMIN 4.2 08/06/2022   BILITOT 0.3 09/25/2021   ALKPHOS 51 09/25/2021   AST 19 09/25/2021   ALT 14 09/25/2021   Last thyroid functions Lab Results  Component Value Date   TSH 1.15 08/06/2022   T4TOTAL 8.2 02/17/2020      Objective:  BP 122/76   Pulse 80   Temp (!) 97.4 F (36.3 C) (Temporal)   Ht 5\' 1"  (1.549 m)   Wt 102 lb 3.2 oz (46.4 kg)   SpO2 95%   BMI 19.31 kg/m     Physical Exam Vitals and nursing note reviewed. Exam conducted with a chaperone present.  Cardiovascular:     Rate and Rhythm: Normal rate.     Pulses: Normal pulses.  Pulmonary:     Effort: Pulmonary effort is normal.  Abdominal:     General: Bowel sounds are normal. There is no distension.     Palpations: Abdomen is soft.     Tenderness: There is no abdominal tenderness. There is no right CVA tenderness, left CVA tenderness or  guarding.  Genitourinary:    Rectum: Guaiac result negative. External hemorrhoid present. No mass, tenderness or anal fissure. Normal anal tone.  Neurological:     Mental Status: She is alert and oriented to person, place, and time.     Results for orders placed or performed in visit on 11/05/22  IFOBT POC (occult bld, rslt in office)  Result Value Ref Range   IFOBT Negative       Assessment & Plan:    Problem List Items Addressed This Visit     Weight loss, unintentional    9lbs weight loss in last 1year Sister reports no change in appetite. She noted blood in commode yesterday after Ms. Chenee had a BM. She has also noticed loose stool without change in diet. Ms. Letrice denies any ABDOMEN pain or nausea or difficulty swallowing or rectal pain.  POC iFOB was negative today. Sister-Ms. Tinley agreed to CT ABDOMEN/pelvis with oral contrast only. Avoid IV  contrast due to CKD stage 4. She also agreed to GI referral.      Relevant Orders   Ambulatory referral to Gastroenterology   CT ABDOMEN PELVIS WO CONTRAST   Other Visit Diagnoses     BRBPR (bright red blood per rectum)    -  Primary   Relevant Medications   hydrocortisone (PROCTOSOL HC) 2.5 % rectal cream   Other Relevant Orders   Ambulatory referral to Gastroenterology   CT ABDOMEN PELVIS WO CONTRAST   IFOBT POC (occult bld, rslt in office) (Completed)   Diarrhea, unspecified type       Relevant Orders   Ambulatory referral to Gastroenterology   CT ABDOMEN PELVIS WO CONTRAST   Immunization due       Relevant Orders   Flu Vaccine Trivalent High Dose (Fluad) (Completed)      Return if symptoms worsen or fail to improve.     Alysia Penna, NP

## 2022-11-08 ENCOUNTER — Ambulatory Visit
Admission: RE | Admit: 2022-11-08 | Discharge: 2022-11-08 | Disposition: A | Discharge status: Home / Self Care | Payer: Medicare HMO | Source: Ambulatory Visit | Attending: Nurse Practitioner

## 2022-11-08 ENCOUNTER — Other Ambulatory Visit: Payer: Medicare HMO

## 2022-11-08 DIAGNOSIS — K625 Hemorrhage of anus and rectum: Secondary | ICD-10-CM

## 2022-11-08 DIAGNOSIS — R197 Diarrhea, unspecified: Secondary | ICD-10-CM

## 2022-11-08 DIAGNOSIS — I700 Atherosclerosis of aorta: Secondary | ICD-10-CM | POA: Diagnosis not present

## 2022-11-08 DIAGNOSIS — R634 Abnormal weight loss: Secondary | ICD-10-CM | POA: Diagnosis not present

## 2022-11-08 DIAGNOSIS — K921 Melena: Secondary | ICD-10-CM | POA: Diagnosis not present

## 2022-11-11 ENCOUNTER — Encounter: Payer: Self-pay | Admitting: Nurse Practitioner

## 2022-11-11 DIAGNOSIS — K5900 Constipation, unspecified: Secondary | ICD-10-CM | POA: Insufficient documentation

## 2022-11-11 DIAGNOSIS — I700 Atherosclerosis of aorta: Secondary | ICD-10-CM | POA: Insufficient documentation

## 2022-11-12 ENCOUNTER — Encounter: Payer: Self-pay | Admitting: Internal Medicine

## 2022-11-20 ENCOUNTER — Telehealth: Payer: Self-pay | Admitting: Nurse Practitioner

## 2022-11-20 NOTE — Telephone Encounter (Signed)
Forgot to route

## 2022-11-20 NOTE — Telephone Encounter (Signed)
Ruth Nelson [phone]   They would like a call to discuss referrals.

## 2022-11-21 NOTE — Telephone Encounter (Signed)
LVM to return call.

## 2022-11-26 NOTE — Telephone Encounter (Signed)
Caller Name: Unk Pinto, sister Call back phone #: [phone]  Reason for Call: pt GI issues (blood in stool, diarrhea, and unintentional weight loss) have cleared up. Does Claris Gower still advise that pt schedule with GI doctor?

## 2022-11-26 NOTE — Telephone Encounter (Signed)
Pts sister advised of recommendations.

## 2022-11-26 NOTE — Telephone Encounter (Signed)
Please advise 

## 2022-11-27 ENCOUNTER — Encounter: Payer: Self-pay | Admitting: Nurse Practitioner

## 2022-12-09 ENCOUNTER — Telehealth: Payer: Self-pay | Admitting: Nurse Practitioner

## 2022-12-09 NOTE — Telephone Encounter (Signed)
Ruth Nelson 1940-07-27   Pt is asking if it is important to have the kidney referral. I could not find a referral for kidneys.but told her you would call her back to discuss.

## 2022-12-10 NOTE — Telephone Encounter (Signed)
Returned call spoke with Unk Pinto to inform her there is a referral to [place] stated she had spoken to PCP a week in a half ago and needed clarification on whether she should schedule the appt or not

## 2022-12-10 NOTE — Telephone Encounter (Signed)
Left voicemail asking patient to return call at [phone].

## 2022-12-19 ENCOUNTER — Telehealth: Payer: Self-pay | Admitting: Nurse Practitioner

## 2022-12-19 NOTE — Telephone Encounter (Signed)
Pharmacy said this med is on back order. levothyroxine (SYNTHROID) 50 MCG tablet [[id number]]  They do have it from another manufacturer.  [id number] - [place], [place] - [address] AT [facility] [address], [place] [address] Phone: [phone]  Fax: [phone]

## 2022-12-20 NOTE — Telephone Encounter (Signed)
Pts sister called to check the status of this med.

## 2022-12-26 NOTE — Telephone Encounter (Signed)
LVM to return call in regard to med refill issue.

## 2023-01-02 DIAGNOSIS — D631 Anemia in chronic kidney disease: Secondary | ICD-10-CM | POA: Diagnosis not present

## 2023-01-02 DIAGNOSIS — N1330 Unspecified hydronephrosis: Secondary | ICD-10-CM | POA: Diagnosis not present

## 2023-01-02 DIAGNOSIS — I129 Hypertensive chronic kidney disease with stage 1 through stage 4 chronic kidney disease, or unspecified chronic kidney disease: Secondary | ICD-10-CM | POA: Diagnosis not present

## 2023-01-02 DIAGNOSIS — N184 Chronic kidney disease, stage 4 (severe): Secondary | ICD-10-CM | POA: Diagnosis not present

## 2023-01-02 DIAGNOSIS — N2581 Secondary hyperparathyroidism of renal origin: Secondary | ICD-10-CM | POA: Diagnosis not present

## 2023-02-12 DIAGNOSIS — N130 Hydronephrosis with ureteropelvic junction obstruction: Secondary | ICD-10-CM | POA: Diagnosis not present

## 2023-03-05 ENCOUNTER — Ambulatory Visit: Payer: Medicare HMO | Admitting: Nurse Practitioner

## 2023-03-05 NOTE — Progress Notes (Addendum)
 03/06/2023 Ruth Nelson [id number] 1940/03/02  Referring provider: Katheen Roselie Rockford, NP Primary GI doctor: Dr. Stacia  ASSESSMENT AND PLAN:  Unexplained Weight Loss 13-pound weight loss over the past year, with recent loose stools and previous constipation. No anemia or occult blood in stool as of September. CT scan showed thickening and inflammation in the stomach and unremarkable colon other than constipation. No family history of colon cancer. -Order labs to check CBC, iron ferritin -Consider endoscopy to investigate stomach thickening seen on CT scan. -Discuss potential for colonoscopy with change in bowel habits as patient's never had endoscopic evaluation prior  -Sister is POA, patient has fairly severe dementia, resides at adult daycare during the day, sister is uncertain she would like the patient to go through EGD or colonoscopy as they would not pursue treatment if there was a malignancy found which is reasonable. -We discussed potentially just doing an upper endoscopy which I think she would be appropriate for [facility] versus EGD colonoscopy versus no evaluation but knowing we may be missing a malignancy. - monitor weight add Ensure -At this time the patient's sister who is POA would like to decline any endoscopic evaluation knowing that a malignancy may be missed, but also knowing that if one was found they would not pursue treatment.  She will call if any worsening symptoms or change in symptoms or if she changes her mind about endoscopic evaluation. -Patient sister declined follow-up at this time but will get back in contact with her office if anything changes.  Change in Bowel Habits Transition from constipation to loose stools over the past few months. Currently on Dulcolax nightly. -Add fiber supplement such as Benefiber or Citrucel to diet to help form stools.  Nutrition Possible malabsorption or inadequate intake contributing to weight loss. -Consider adding  high-calorie foods such as avocados, butter, and protein powders to diet. -Consider supplementing diet with meal replacement drinks like Ensure or Boost.  CKD stage IV  Dementia Severe dementia, pleasant Sister is POA   Patient Care Team: Nche, Roselie Rockford, NP as PCP - General (Internal Medicine) Sandria Selma LABOR, RPH (Inactive) (Pharmacist)  HISTORY OF PRESENT ILLNESS: 83 y.o. female with a past medical history of hypertension, aortic atherosclerosis, hypothyroidism, dementia, CKD stage IV, constipation and others listed below presents for evaluation of diarrhea and weight loss.   Unable to review any previous endoscopic evaluation. 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  History of Present Illness   The patient, accompanied by her sister, presents with a history of loose stools and weight loss over the past four months. She reports no family history of colon cancer and has never undergone a colonoscopy or endoscopy. The patient's sister noticed a distended stomach approximately four months ago, which has since improved with bowel movement's and dulcolax. The patient has experienced a weight loss of approximately 13 pounds over the past year, from 114 pounds to her current weight of 100 pounds but her sister states her normal weight is around 100 lbs.  The patient denies any rectal bleeding currently, attributing previous instances to hemorrhoids, last  time months ago.  She reports no nausea, vomiting, heartburn, or difficulty swallowing. She does, however, report early satiety.  The patient denies any abdominal pain or discomfort.  Regarding bowel movements, the patient was initially constipated, but more recently has been experiencing loose stools. She reports having loose stools at least twice a day, but not every day.  The patient is currently on baby aspirin and Dulcolax, which she takes consistently at night. She denies any shortness of breath, chest discomfort, or leg swelling. She has not been on any blood thinners or prednisone, and she does not consume alcohol.  A recent CT scan showed two abnormalities:some thickening and inflammation in the stomach and fecal retention. The patient's fecal occult blood test was negative, and she was not anemic as of her last check in September. Despite these findings, the patient and her sister have expressed a preference against invasive procedures or treatments such as surgery, radiation, or chemotherapy due to patient's dementia.  She has dementia and is at day program, has severe memory impairment.     Wt Readings from Last 10 Encounters:  03/06/23 100 lb 4 oz (45.5 kg)  11/05/22 102 lb 3.2 oz (46.4 kg)  10/08/22 102 lb (46.3 kg)  09/30/22 102 lb 9.6 oz (46.5 kg)  09/10/22 104 lb (47.2 kg)  08/06/22 104 lb 6.4 oz (47.4 kg)  05/13/22 110 lb (49.9 kg)  04/24/22 112 lb (50.8 kg)  09/25/21 111 lb (50.3 kg)  05/01/21 113 lb (51.3 kg)     She  reports that she has never smoked. She has never used smokeless tobacco. She reports that she does not drink alcohol and does not use drugs.  RELEVANT LABS AND IMAGING:  Results   LABS Fecal occult blood: Negative (10/2022) CBC: Within normal limits, no anemia (09/2022)  RADIOLOGY CT scan: Gastric wall thickening and inflammation, colonic abnormality      CBC    Component Value Date/Time   WBC 4.4 09/25/2021 1041   RBC 4.92 09/25/2021 1041    HGB 14.4 09/25/2021 1041   HCT 45.1 09/25/2021 1041   PLT 299.0 09/25/2021 1041   MCV 91.8 09/25/2021 1041   MCHC 31.9 09/25/2021 1041   RDW 12.7 09/25/2021 1041   LYMPHSABS 1.3 01/18/2020 1018   MONOABS 0.5 01/18/2020 1018   EOSABS 0.2 01/18/2020 1018   BASOSABS 0.1 01/18/2020 1018   No results for input(s): HGB in the last 8760 hours.  CMP     Component Value Date/Time   NA 141 08/22/2022 0815   K 4.9 08/22/2022 0815   CL 104 08/22/2022 0815   CO2 30 08/22/2022 0815   GLUCOSE 77 08/22/2022 0815   BUN 22 08/22/2022 0815   CREATININE 1.68 (H) 08/22/2022 0815   CALCIUM  9.8 08/22/2022 0815   PROT 7.7 09/25/2021 1041   ALBUMIN 4.2 08/06/2022 1419   AST 19 09/25/2021 1041   ALT 14 09/25/2021 1041   ALKPHOS 51 09/25/2021 1041   BILITOT 0.3 09/25/2021 1041      Latest Ref Rng & Units 08/06/2022    2:19 PM 09/25/2021   10:41 AM 01/23/2021   12:09 PM  Hepatic Function  Total Protein 6.0 - 8.3 g/dL  7.7  7.2   Albumin 3.5 - 5.2 g/dL 4.2  3.9  4.3   AST 0 - 37 U/L  19  19   ALT 0 - 35 U/L  14  4  Alk Phosphatase 39 - 117 U/L  51  52   Total Bilirubin 0.2 - 1.2 mg/dL  0.3  0.6       Current Medications:   Current Outpatient Medications (Endocrine & Metabolic):    levothyroxine  (SYNTHROID ) 50 MCG tablet, TAKE 1 TABLET BY MOUTH DAILY BEFORE BREAKFAST  Current Outpatient Medications (Cardiovascular):    amLODipine  (NORVASC ) 5 MG tablet, TAKE 1 TABLET BY MOUTH DAILY.   Current Outpatient Medications (Analgesics):    aspirin EC 81 MG tablet, Take 81 mg by mouth daily. Swallow whole.   Current Outpatient Medications (Other):    bisacodyl (DULCOLAX) 5 MG EC tablet, Take 5 mg by mouth daily as needed for moderate constipation.   citalopram  (CELEXA ) 20 MG tablet, Take 1 tablet (20 mg total) by mouth daily.   hydrocortisone  (PROCTOSOL HC) 2.5 % rectal cream, Place 1 Application rectally 2 (two) times daily.   Multiple Vitamin (ONE-A-DAY ESSENTIAL PO), Take by mouth. Women's  One-A-Day   PREVNAR 20 0.5 ML injection,    SHINGRIX injection,   Medical History:  Past Medical History:  Diagnosis Date   High blood pressure    Allergies: No Known Allergies   Surgical History:  She  has no past surgical history on file. Family History:  Her family history is not on file.  REVIEW OF SYSTEMS  : All other systems reviewed and negative except where noted in the History of Present Illness.  PHYSICAL EXAM: BP 120/80 (BP Location: Left Arm, Patient Position: Sitting, Cuff Size: Normal)   Pulse 86   Ht 5' 1 (1.549 m)   Wt 100 lb 4 oz (45.5 kg)   SpO2 96%   BMI 18.94 kg/m  General Appearance: Thin appearing female in no apparent distress. Head:   Normocephalic and atraumatic. Eyes:  sclerae anicteric,conjunctive pink  Respiratory: Respiratory effort normal, BS equal bilaterally without rales, rhonchi, wheezing. Cardio: RRR with no MRGs. Peripheral pulses intact.  Abdomen: Soft,  Non-distended ,active bowel sounds. No tenderness . No masses. Rectal: declines Musculoskeletal: Full ROM, Normal gait. Without edema. Skin:  Dry and intact without significant lesions or rashes Neuro: Alert and  oriented x1,  No focal deficits. Psych:  Cooperative. Normal mood and affect.    Alan JONELLE Coombs, PA-C 9:56 AM

## 2023-03-06 ENCOUNTER — Ambulatory Visit (INDEPENDENT_AMBULATORY_CARE_PROVIDER_SITE_OTHER): Payer: Medicare HMO | Admitting: Physician Assistant

## 2023-03-06 ENCOUNTER — Encounter: Payer: Self-pay | Admitting: Physician Assistant

## 2023-03-06 ENCOUNTER — Other Ambulatory Visit (INDEPENDENT_AMBULATORY_CARE_PROVIDER_SITE_OTHER): Payer: Medicare HMO

## 2023-03-06 VITALS — BP 120/80 | HR 86 | Ht 61.0 in | Wt 100.2 lb

## 2023-03-06 DIAGNOSIS — K3189 Other diseases of stomach and duodenum: Secondary | ICD-10-CM

## 2023-03-06 DIAGNOSIS — F03C0 Unspecified dementia, severe, without behavioral disturbance, psychotic disturbance, mood disturbance, and anxiety: Secondary | ICD-10-CM | POA: Diagnosis not present

## 2023-03-06 DIAGNOSIS — N184 Chronic kidney disease, stage 4 (severe): Secondary | ICD-10-CM

## 2023-03-06 DIAGNOSIS — R634 Abnormal weight loss: Secondary | ICD-10-CM

## 2023-03-06 DIAGNOSIS — F03918 Unspecified dementia, unspecified severity, with other behavioral disturbance: Secondary | ICD-10-CM

## 2023-03-06 DIAGNOSIS — E639 Nutritional deficiency, unspecified: Secondary | ICD-10-CM

## 2023-03-06 DIAGNOSIS — R194 Change in bowel habit: Secondary | ICD-10-CM

## 2023-03-06 DIAGNOSIS — K5904 Chronic idiopathic constipation: Secondary | ICD-10-CM

## 2023-03-06 LAB — CBC WITH DIFFERENTIAL/PLATELET
Basophils Absolute: 0 10*3/uL (ref 0.0–0.1)
Basophils Relative: 0.6 % (ref 0.0–3.0)
Eosinophils Absolute: 0.1 10*3/uL (ref 0.0–0.7)
Eosinophils Relative: 3.6 % (ref 0.0–5.0)
HCT: 44.8 % (ref 36.0–46.0)
Hemoglobin: 14.5 g/dL (ref 12.0–15.0)
Lymphocytes Relative: 26.2 % (ref 12.0–46.0)
Lymphs Abs: 0.9 10*3/uL (ref 0.7–4.0)
MCHC: 32.4 g/dL (ref 30.0–36.0)
MCV: 92.8 fL (ref 78.0–100.0)
Monocytes Absolute: 0.3 10*3/uL (ref 0.1–1.0)
Monocytes Relative: 9.4 % (ref 3.0–12.0)
Neutro Abs: 2.1 10*3/uL (ref 1.4–7.7)
Neutrophils Relative %: 60.2 % (ref 43.0–77.0)
Platelets: 263 10*3/uL (ref 150.0–400.0)
RBC: 4.83 Mil/uL (ref 3.87–5.11)
RDW: 12.4 % (ref 11.5–15.5)
WBC: 3.5 10*3/uL — ABNORMAL LOW (ref 4.0–10.5)

## 2023-03-06 LAB — IBC + FERRITIN
Ferritin: 19.6 ng/mL (ref 10.0–291.0)
Iron: 144 ug/dL (ref 42–145)
Saturation Ratios: 37.7 % (ref 20.0–50.0)
TIBC: 382.2 ug/dL (ref 250.0–450.0)
Transferrin: 273 mg/dL (ref 212.0–360.0)

## 2023-03-06 LAB — COMPREHENSIVE METABOLIC PANEL
ALT: 13 U/L (ref 0–35)
AST: 19 U/L (ref 0–37)
Albumin: 4.3 g/dL (ref 3.5–5.2)
Alkaline Phosphatase: 65 U/L (ref 39–117)
BUN: 21 mg/dL (ref 6–23)
CO2: 28 meq/L (ref 19–32)
Calcium: 9.7 mg/dL (ref 8.4–10.5)
Chloride: 103 meq/L (ref 96–112)
Creatinine, Ser: 1.58 mg/dL — ABNORMAL HIGH (ref 0.40–1.20)
GFR: 30.15 mL/min — ABNORMAL LOW (ref >60.00)
Glucose, Bld: 88 mg/dL (ref 70–99)
Potassium: 4.5 meq/L (ref 3.5–5.1)
Sodium: 140 meq/L (ref 135–145)
Total Bilirubin: 0.4 mg/dL (ref 0.2–1.2)
Total Protein: 8 g/dL (ref 6.0–8.3)

## 2023-03-06 LAB — VITAMIN B12: Vitamin B-12: 435 pg/mL (ref 211–911)

## 2023-03-06 NOTE — Patient Instructions (Addendum)
 Your provider has requested that you go to the basement level for lab work before leaving today. Press B on the elevator. The lab is located at the first door on the left as you exit the elevator.  FIBER SUPPLEMENT You can do metamucil or fibercon once or twice a day but if this causes gas/bloating please switch to Benefiber or Citracel.  Fiber is good for constipation/diarrhea/irritable bowel syndrome.  It can also help with weight loss and can help lower your bad cholesterol (LDL).  Please do 1 TBSP in the morning in water, coffee, or tea.  It can take up to a month before you can see a difference with your bowel movements.  It is cheapest from [employer], [employer], [employer].   Toileting tips to help with your constipation - Drink at least 64-80 ounces of water/liquid per day. - Establish a time to try to move your bowels every day.  For many people, this is after a cup of coffee or after a meal such as breakfast. - Sit all of the way back on the toilet keeping your back fairly straight and while sitting up, try to rest the tops of your forearms on your upper thighs.   - Raising your feet with a step stool/squatty potty can be helpful to improve the angle that allows your stool to pass through the rectum. - Relax the rectum feeling it bulge toward the toilet water.  If you feel your rectum raising toward your body, you are contracting rather than relaxing. - Breathe in and slowly exhale. Belly breath by expanding your belly towards your belly button. Keep belly expanded as you gently direct pressure down and back to the anus.  A low pitched GRRR sound can assist with increasing intra-abdominal pressure.  (Can also trying to blow on a pinwheel and make it move, this helps with the same belly breathing) - Repeat 3-4 times. If unsuccessful, contract the pelvic floor to restore normal tone and get off the toilet.  Avoid excessive straining. - To reduce excessive wiping by teaching your anus to normally  contract, place hands on outer aspect of knees and resist knee movement outward.  Hold 5-10 second then place hands just inside of knees and resist inward movement of knees.  Hold 5 seconds.  Repeat a few times each way.  Go to the ER if unable to pass gas, severe AB pain, unable to hold down food, any shortness of breath of chest pain.  You can add on protein and things high calorie for weight gain - Can add ensure/boost to ice cream for a shake - can add protein powder to oatmeal after cooked or to a fruit smooth - avocado has high calorie, good to add to proteins - nuts and peanut butter are good to eat or add to yogurt/smoothies - Greek yogurt is good

## 2023-03-07 ENCOUNTER — Other Ambulatory Visit: Payer: Self-pay | Admitting: Nurse Practitioner

## 2023-03-07 DIAGNOSIS — R4689 Other symptoms and signs involving appearance and behavior: Secondary | ICD-10-CM

## 2023-03-10 NOTE — Progress Notes (Signed)
 Agree with the assessment and plan as outlined by Quentin Mulling,  PA-C.  Agree with deferring invasive work up in patient with advanced dementia  Kamari Buch E. Tomasa Rand, MD

## 2023-04-29 DIAGNOSIS — I129 Hypertensive chronic kidney disease with stage 1 through stage 4 chronic kidney disease, or unspecified chronic kidney disease: Secondary | ICD-10-CM | POA: Diagnosis not present

## 2023-04-29 DIAGNOSIS — N2581 Secondary hyperparathyroidism of renal origin: Secondary | ICD-10-CM | POA: Diagnosis not present

## 2023-04-29 DIAGNOSIS — N184 Chronic kidney disease, stage 4 (severe): Secondary | ICD-10-CM | POA: Diagnosis not present

## 2023-04-29 DIAGNOSIS — N1330 Unspecified hydronephrosis: Secondary | ICD-10-CM | POA: Diagnosis not present

## 2023-04-29 DIAGNOSIS — D631 Anemia in chronic kidney disease: Secondary | ICD-10-CM | POA: Diagnosis not present

## 2023-04-29 LAB — PROTEIN / CREATININE RATIO, URINE
Albumin, U: <3
Creatinine, Urine: 53

## 2023-04-29 LAB — MICROALBUMIN / CREATININE URINE RATIO: Microalb Creat Ratio: <6

## 2023-05-14 ENCOUNTER — Other Ambulatory Visit: Payer: Self-pay | Admitting: Nurse Practitioner

## 2023-05-14 DIAGNOSIS — I10 Essential (primary) hypertension: Secondary | ICD-10-CM

## 2023-05-15 NOTE — Telephone Encounter (Signed)
 Left a voice message asking to give me a call back at [phone].  Patient needs an CPE and/or office visit.

## 2023-05-16 ENCOUNTER — Ambulatory Visit: Payer: Medicare HMO

## 2023-05-16 DIAGNOSIS — N2581 Secondary hyperparathyroidism of renal origin: Secondary | ICD-10-CM | POA: Diagnosis not present

## 2023-05-16 DIAGNOSIS — Z0000 Encounter for general adult medical examination without abnormal findings: Secondary | ICD-10-CM | POA: Diagnosis not present

## 2023-05-16 DIAGNOSIS — N189 Chronic kidney disease, unspecified: Secondary | ICD-10-CM | POA: Diagnosis not present

## 2023-05-16 DIAGNOSIS — N184 Chronic kidney disease, stage 4 (severe): Secondary | ICD-10-CM | POA: Diagnosis not present

## 2023-05-16 NOTE — Patient Instructions (Signed)
 Ms. Birden , Thank you for taking time to come for your Medicare Wellness Visit. I appreciate your ongoing commitment to your health goals. Please review the following plan we discussed and let me know if I can assist you in the future.   Referrals/Orders/Follow-Ups/Clinician Recommendations: none  This is a list of the screening recommended for you and due dates:  Health Maintenance  Topic Date Due   COVID-19 Vaccine (6 - 2024-25 season) 10/27/2022   Medicare Annual Wellness Visit  05/15/2024   Pneumonia Vaccine  Completed   Flu Shot  Completed   DEXA scan (bone density measurement)  Completed   Zoster (Shingles) Vaccine  Completed   HPV Vaccine  Aged Out   DTaP/Tdap/Td vaccine  Discontinued    Advanced directives: (ACP Link)Information on Advanced Care Planning can be found at [facility] Advance Health Care Directives Advance Health Care Directives. [email or web address]   Next Medicare Annual Wellness Visit scheduled for next year: Yes  insert Preventive Care attachment Insert FALL PREVENTION attachment if needed

## 2023-05-16 NOTE — Progress Notes (Signed)
 Subjective:   TRUST CRAGO is a 83 y.o. who presents for a Medicare Wellness preventive visit.  Visit Complete: Virtual I connected with  Rudene Re on 05/16/23 by a audio enabled telemedicine application and verified that I am speaking with the correct person using two identifiers.  Patient Location: Home  Provider Location: Office/Clinic  I discussed the limitations of evaluation and management by telemedicine. The patient expressed understanding and agreed to proceed.  Vital Signs: Because this visit was a virtual/telehealth visit, some criteria may be missing or patient reported. Any vitals not documented were not able to be obtained and vitals that have been documented are patient reported.  VideoError- Librarian, academic were attempted between this provider and patient, however failed, due to patient having technical difficulties OR patient did not have access to video capability.  We continued and completed visit with audio only.   Persons Participating in Visit: Patient assisted by sister.  AWV Questionnaire: No: Patient Medicare AWV questionnaire was not completed prior to this visit.  Cardiac Risk Factors include: advanced age (>1men, >66 women);hypertension     Objective:    Today's Vitals   There is no height or weight on file to calculate BMI.     05/16/2023    2:27 PM 05/13/2022    2:47 PM 05/01/2021    3:00 PM 04/04/2020   10:10 AM  Advanced Directives  Does Patient Have a Medical Advance Directive? No No No No  Would patient like information on creating a medical advance directive?   No - Guardian declined No - Patient declined    Current Medications (verified) Outpatient Encounter Medications as of 05/16/2023  Medication Sig   amLODipine (NORVASC) 5 MG tablet TAKE 1 TABLET BY MOUTH DAILY.   aspirin EC 81 MG tablet Take 81 mg by mouth daily. Swallow whole.   bisacodyl (DULCOLAX) 5 MG EC tablet Take 5 mg by mouth daily as  needed for moderate constipation.   citalopram (CELEXA) 20 MG tablet Take 1 tablet (20 mg total) by mouth daily.   hydrocortisone (PROCTOSOL HC) 2.5 % rectal cream Place 1 Application rectally 2 (two) times daily.   levothyroxine (SYNTHROID) 50 MCG tablet TAKE 1 TABLET BY MOUTH DAILY BEFORE BREAKFAST   Multiple Vitamin (ONE-A-DAY ESSENTIAL PO) Take by mouth. Women's One-A-Day   PREVNAR 20 0.5 ML injection  (Patient not taking: Reported on 05/16/2023)   SHINGRIX injection  (Patient not taking: Reported on 05/16/2023)   No facility-administered encounter medications on file as of 05/16/2023.    Allergies (verified) Patient has no known allergies.   History: Past Medical History:  Diagnosis Date   High blood pressure    History reviewed. No pertinent surgical history. Family History  Problem Relation Age of Onset   Pancreatic cancer Neg Hx    Stomach cancer Neg Hx    Esophageal cancer Neg Hx    Colon cancer Neg Hx    Social History   Socioeconomic History   Marital status: Single    Spouse name: Not on file   Number of children: Not on file   Years of education: Not on file   Highest education level: Not on file  Occupational History   Not on file  Tobacco Use   Smoking status: Never   Smokeless tobacco: Never  Vaping Use   Vaping status: Never Used  Substance and Sexual Activity   Alcohol use: Never   Drug use: Never   Sexual activity: Not Currently  Other Topics Concern   Not on file  Social History Narrative   Not on file   Social Drivers of Health   Financial Resource Strain: Low Risk  (05/16/2023)   Overall Financial Resource Strain (CARDIA)    Difficulty of Paying Living Expenses: Not hard at all  Food Insecurity: No Food Insecurity (05/16/2023)   Hunger Vital Sign    Worried About Running Out of Food in the Last Year: Never true    Ran Out of Food in the Last Year: Never true  Transportation Needs: No Transportation Needs (05/16/2023)   PRAPARE -  Administrator, Civil Service (Medical): No    Lack of Transportation (Non-Medical): No  Physical Activity: Sufficiently Active (05/16/2023)   Exercise Vital Sign    Days of Exercise per Week: 5 days    Minutes of Exercise per Session: 60 min  Stress: No Stress Concern Present (05/16/2023)   [employer] of Occupational Health - Occupational Stress Questionnaire    Feeling of Stress : Not at all  Social Connections: Moderately Isolated (05/16/2023)   Social Connection and Isolation Panel [NHANES]    Frequency of Communication with Friends and Family: Once a week    Frequency of Social Gatherings with Friends and Family: More than three times a week    Attends Religious Services: More than 4 times per year    Active Member of [employer] or Organizations: No    Attends Engineer, structural: Never    Marital Status: Never married    Tobacco Counseling Counseling given: Not Answered    Clinical Intake:  Pre-visit preparation completed: Yes  Pain : No/denies pain     Nutritional Risks: None Diabetes: No  No results found for: "HGBA1C"   How often do you need to have someone help you when you read instructions, pamphlets, or other written materials from your doctor or pharmacy?: 3 - Sometimes  Interpreter Needed?: No  Information entered by :: NAllen LPN   Activities of Daily Living     05/16/2023    2:20 PM  In your present state of health, do you have any difficulty performing the following activities:  Hearing? 0  Vision? 0  Difficulty concentrating or making decisions? 1  Walking or climbing stairs? 0  Dressing or bathing? 0  Doing errands, shopping? 1  Preparing Food and eating ? N  Using the Toilet? N  In the past six months, have you accidently leaked urine? N  Do you have problems with loss of bowel control? N  Managing your Medications? Y  Managing your Finances? Y  Housekeeping or managing your Housekeeping? N    Patient Care  Team: Nche, Bonna Gains, NP as PCP - General (Internal Medicine)  Indicate any recent Medical Services you may have received from other than [facility] providers in the past year (date may be approximate).     Assessment:   This is a routine wellness examination for Jeni.  Hearing/Vision screen Hearing Screening - Comments:: Denies hearing issues Vision Screening - Comments:: Regular eye exams, [facility]   Goals Addressed             This Visit's Progress    Patient Stated       05/16/2023, denies goals       Depression Screen     05/16/2023    2:28 PM 09/30/2022    3:22 PM 05/13/2022    2:48 PM 04/24/2022    8:38 AM 05/01/2021  3:06 PM 05/01/2021    2:59 PM 04/04/2020   10:14 AM  PHQ 2/9 Scores  PHQ - 2 Score 0 0 0 0 0 0 0  PHQ- 9 Score 0          Fall Risk     05/16/2023    2:28 PM 09/30/2022    3:21 PM 05/13/2022    2:48 PM 04/24/2022    8:38 AM 05/01/2021    3:01 PM  Fall Risk   Falls in the past year? 0 0 0 0 0  Number falls in past yr: 0 0 0 0 0  Injury with Fall? 0 0 0 0 0  Risk for fall due to : Medication side effect No Fall Risks Medication side effect No Fall Risks No Fall Risks  Follow up Falls prevention discussed;Falls evaluation completed Falls prevention discussed Falls prevention discussed;Education provided;Falls evaluation completed Falls evaluation completed     MEDICARE RISK AT HOME:  Medicare Risk at Home Any stairs in or around the home?: Yes If so, are there any without handrails?: No Home free of loose throw rugs in walkways, pet beds, electrical cords, etc?: Yes Adequate lighting in your home to reduce risk of falls?: Yes Life alert?: No Use of a cane, walker or w/c?: No Grab bars in the bathroom?: Yes Shower chair or bench in shower?: No Elevated toilet seat or a handicapped toilet?: Yes  TIMED UP AND GO:  Was the test performed?  No  Cognitive Function: Impaired: Cognitive status assessed by direct observation. Patient has current  diagnosis of cognitive impairment.    01/14/2020    1:16 PM  MMSE - Mini Mental State Exam  Orientation to time 0  Orientation to Place 0  Registration 3  Attention/ Calculation 0  Recall 0  Language- name 2 objects 2  Language- repeat 1  Language- follow 3 step command 3  Language- read & follow direction 1  Write a sentence 0  Copy design 0  Total score 10        Immunizations Immunization History  Administered Date(s) Administered   Fluad Quad(high Dose 65+) 01/14/2020, 12/27/2020   Fluad Trivalent(High Dose 65+) 11/05/2022   Influenza,inj,Quad PF,6+ Mos 10/23/2021   Moderna Sars-Covid-2 Vaccination 03/28/2019, 04/25/2019, 02/28/2020   PNEUMOCOCCAL CONJUGATE-20 12/27/2020, 03/01/2022   Pfizer Covid-19 Vaccine Bivalent Booster 65yrs & up 02/12/2021   Unspecified SARS-COV-2 Vaccination 03/01/2022   Zoster Recombinant(Shingrix) 03/01/2022, 05/13/2022    Screening Tests Health Maintenance  Topic Date Due   COVID-19 Vaccine (6 - 2024-25 season) 10/27/2022   Medicare Annual Wellness (AWV)  05/15/2024   Pneumonia Vaccine 80+ Years old  Completed   INFLUENZA VACCINE  Completed   DEXA SCAN  Completed   Zoster Vaccines- Shingrix  Completed   HPV VACCINES  Aged Out   DTaP/Tdap/Td  Discontinued    Health Maintenance  Health Maintenance Due  Topic Date Due   COVID-19 Vaccine (6 - 2024-25 season) 10/27/2022   Health Maintenance Items Addressed: Up to date  Additional Screening:  Vision Screening: Recommended annual ophthalmology exams for early detection of glaucoma and other disorders of the eye.  Dental Screening: Recommended annual dental exams for proper oral hygiene  Community Resource Referral / Chronic Care Management: CRR required this visit?  No   CCM required this visit?  No     Plan:     I have personally reviewed and noted the following in the patient's chart:   Medical and social history Use of alcohol,  tobacco or illicit drugs  Current  medications and supplements including opioid prescriptions. Patient is not currently taking opioid prescriptions. Functional ability and status Nutritional status Physical activity Advanced directives List of other physicians Hospitalizations, surgeries, and ER visits in previous 12 months Vitals Screenings to include cognitive, depression, and falls Referrals and appointments  In addition, I have reviewed and discussed with patient certain preventive protocols, quality metrics, and best practice recommendations. A written personalized care plan for preventive services as well as general preventive health recommendations were provided to patient.     Barb Merino, LPN   1/61/0960   After Visit Summary: (Pick Up) Due to this being a telephonic visit, with patients personalized plan was offered to patient and patient has requested to Pick up at office.  Notes: Nothing significant to report at this time.

## 2023-05-19 NOTE — Telephone Encounter (Signed)
 This encounter was created in error - please disregard.

## 2023-06-12 ENCOUNTER — Other Ambulatory Visit: Payer: Self-pay | Admitting: Nurse Practitioner

## 2023-06-12 DIAGNOSIS — E038 Other specified hypothyroidism: Secondary | ICD-10-CM

## 2023-08-12 ENCOUNTER — Other Ambulatory Visit: Payer: Self-pay | Admitting: Nurse Practitioner

## 2023-08-12 DIAGNOSIS — I10 Essential (primary) hypertension: Secondary | ICD-10-CM

## 2023-08-12 NOTE — Telephone Encounter (Signed)
 Medication: Amlodipine  (Norvasc ) 5 mg  Directions: Take 1 tablet by mouth daily  Last given: 05/15/23 Number refills: 0 Last o/v: 11/05/22 (Acute), 10/08/22 Follow up: 3 months (Around 01/08/23)- NOT scheduled  Labs: 08/22/22

## 2023-09-05 ENCOUNTER — Other Ambulatory Visit: Payer: Self-pay | Admitting: Nurse Practitioner

## 2023-09-05 ENCOUNTER — Ambulatory Visit: Payer: Self-pay | Admitting: Nurse Practitioner

## 2023-09-05 ENCOUNTER — Ambulatory Visit (INDEPENDENT_AMBULATORY_CARE_PROVIDER_SITE_OTHER): Admitting: Nurse Practitioner

## 2023-09-05 ENCOUNTER — Encounter: Payer: Self-pay | Admitting: Nurse Practitioner

## 2023-09-05 VITALS — BP 118/76 | HR 88 | Temp 97.9°F | Ht 60.0 in | Wt 92.8 lb

## 2023-09-05 DIAGNOSIS — R4689 Other symptoms and signs involving appearance and behavior: Secondary | ICD-10-CM

## 2023-09-05 DIAGNOSIS — I10 Essential (primary) hypertension: Secondary | ICD-10-CM | POA: Diagnosis not present

## 2023-09-05 DIAGNOSIS — N184 Chronic kidney disease, stage 4 (severe): Secondary | ICD-10-CM | POA: Diagnosis not present

## 2023-09-05 DIAGNOSIS — E038 Other specified hypothyroidism: Secondary | ICD-10-CM | POA: Diagnosis not present

## 2023-09-05 DIAGNOSIS — M810 Age-related osteoporosis without current pathological fracture: Secondary | ICD-10-CM

## 2023-09-05 DIAGNOSIS — I700 Atherosclerosis of aorta: Secondary | ICD-10-CM

## 2023-09-05 DIAGNOSIS — Z0001 Encounter for general adult medical examination with abnormal findings: Secondary | ICD-10-CM | POA: Diagnosis not present

## 2023-09-05 DIAGNOSIS — E7800 Pure hypercholesterolemia, unspecified: Secondary | ICD-10-CM | POA: Insufficient documentation

## 2023-09-05 DIAGNOSIS — H6123 Impacted cerumen, bilateral: Secondary | ICD-10-CM | POA: Diagnosis not present

## 2023-09-05 DIAGNOSIS — Z111 Encounter for screening for respiratory tuberculosis: Secondary | ICD-10-CM

## 2023-09-05 DIAGNOSIS — F03918 Unspecified dementia, unspecified severity, with other behavioral disturbance: Secondary | ICD-10-CM

## 2023-09-05 DIAGNOSIS — R634 Abnormal weight loss: Secondary | ICD-10-CM

## 2023-09-05 LAB — CBC
HCT: 45.2 % (ref 36.0–46.0)
Hemoglobin: 14.5 g/dL (ref 12.0–15.0)
MCHC: 32 g/dL (ref 30.0–36.0)
MCV: 91.3 fl (ref 78.0–100.0)
Platelets: 229 K/uL (ref 150.0–400.0)
RBC: 4.95 Mil/uL (ref 3.87–5.11)
RDW: 12.7 % (ref 11.5–15.5)
WBC: 4.2 K/uL (ref 4.0–10.5)

## 2023-09-05 LAB — LIPID PANEL
Cholesterol: 257 mg/dL — ABNORMAL HIGH (ref 0–200)
HDL: 99.4 mg/dL (ref >39.00)
LDL Cholesterol: 145 mg/dL — ABNORMAL HIGH (ref 0–99)
NonHDL: 158.04
Total CHOL/HDL Ratio: 3
Triglycerides: 67 mg/dL (ref 0.0–149.0)
VLDL: 13.4 mg/dL (ref 0.0–40.0)

## 2023-09-05 LAB — COMPREHENSIVE METABOLIC PANEL WITH GFR
ALT: 16 U/L (ref 0–35)
AST: 21 U/L (ref 0–37)
Albumin: 4.4 g/dL (ref 3.5–5.2)
Alkaline Phosphatase: 68 U/L (ref 39–117)
BUN: 24 mg/dL — ABNORMAL HIGH (ref 6–23)
CO2: 30 meq/L (ref 19–32)
Calcium: 10 mg/dL (ref 8.4–10.5)
Chloride: 104 meq/L (ref 96–112)
Creatinine, Ser: 1.53 mg/dL — ABNORMAL HIGH (ref 0.40–1.20)
GFR: 31.22 mL/min — ABNORMAL LOW (ref >60.00)
Glucose, Bld: 96 mg/dL (ref 70–99)
Potassium: 4.3 meq/L (ref 3.5–5.1)
Sodium: 142 meq/L (ref 135–145)
Total Bilirubin: 0.6 mg/dL (ref 0.2–1.2)
Total Protein: 8.1 g/dL (ref 6.0–8.3)

## 2023-09-05 LAB — VITAMIN D 25 HYDROXY (VIT D DEFICIENCY, FRACTURES): VITD: 44.62 ng/mL (ref 30.00–100.00)

## 2023-09-05 LAB — T4, FREE: Free T4: 0.86 ng/dL (ref 0.60–1.60)

## 2023-09-05 LAB — TSH: TSH: 2.22 u[IU]/mL (ref 0.35–5.50)

## 2023-09-05 MED ORDER — DENOSUMAB 60 MG/ML ~~LOC~~ SOSY: 60.0000 mg | PREFILLED_SYRINGE | SUBCUTANEOUS | Status: DC

## 2023-09-05 MED ORDER — DENOSUMAB 60 MG/ML ~~LOC~~ SOSY: 60.0000 mg | PREFILLED_SYRINGE | SUBCUTANEOUS | 1 refills | Status: DC

## 2023-09-05 MED ORDER — LEVOTHYROXINE SODIUM 50 MCG PO TABS: 50.0000 ug | ORAL_TABLET | Freq: Every day | ORAL | 1 refills | Status: DC

## 2023-09-05 MED ORDER — CITALOPRAM HYDROBROMIDE 20 MG PO TABS: 20.0000 mg | ORAL_TABLET | Freq: Every day | ORAL | 3 refills | Status: DC

## 2023-09-05 MED ORDER — ATORVASTATIN CALCIUM 10 MG PO TABS: 10.0000 mg | ORAL_TABLET | Freq: Every evening | ORAL | 1 refills | Status: DC

## 2023-09-05 NOTE — Assessment & Plan Note (Signed)
 Continuous weight loss noted. Abnormal CT ABDOMEN/pelvis on 10/2022 HCPOA-sister Ruth Nelson denies any change in appetite, resolved constipation, she eats daily Gayla denies any N/V or ABDOMEN pain.  Advised to schedule appointment with GI as previously discussed Start drinking ensure 1-2bottles daily

## 2023-09-05 NOTE — Assessment & Plan Note (Addendum)
 Repeat lipid panel: elevated TC and LDL Start atorvastatin  10mg  in PM Repeat labs in 3-51months

## 2023-09-05 NOTE — Assessment & Plan Note (Addendum)
Repeat TSh and T4: stable Maintain levothyroxine dose

## 2023-09-05 NOTE — Assessment & Plan Note (Signed)
 Stable mood Daycare form will be completed

## 2023-09-05 NOTE — Patient Instructions (Addendum)
 Schedule appointment with GI [phone] Go to [address] for CXR Drink ensure 1-2bottles daily Maintain current med doses Go to lab

## 2023-09-05 NOTE — Assessment & Plan Note (Signed)
 Followed by nephrology and urology Repeat CMP today

## 2023-09-05 NOTE — Assessment & Plan Note (Signed)
 BP at goal with amlodipine  No adverse effects BP Readings from Last 3 Encounters:  09/05/23 118/76  03/06/23 120/80  11/05/22 122/76    Maintain med dose

## 2023-09-05 NOTE — Progress Notes (Signed)
 Complete physical exam  Patient: Ruth Nelson   DOB: [birth year]   83 y.o. Female  MRN: [id number] Visit Date: 09/05/2023  Subjective:    Chief Complaint  Patient presents with   Annual Exam    FASTING    Ruth Nelson is a 83 y.o. female who presents today for a complete physical exam. She reports consuming a general diet. Walking daily She generally feels fairly well. She reports sleeping well. She does not have additional problems to discuss today.  Vision:No Dental:Yes STD Screen:No  BP Readings from Last 3 Encounters:  09/05/23 118/76  03/06/23 120/80  11/05/22 122/76   Wt Readings from Last 3 Encounters:  09/05/23 92 lb 12.8 oz (42.1 kg)  03/06/23 100 lb 4 oz (45.5 kg)  11/05/22 102 lb 3.2 oz (46.4 kg)   Most recent fall risk assessment:    05/16/2023    2:28 PM  Fall Risk   Falls in the past year? 0  Number falls in past yr: 0  Injury with Fall? 0  Risk for fall due to : Medication side effect  Follow up Falls prevention discussed;Falls evaluation completed   Depression screen:Yes - No Depression Most recent depression screenings:    05/16/2023    2:28 PM 09/30/2022    3:22 PM  PHQ 2/9 Scores  PHQ - 2 Score 0 0  PHQ- 9 Score 0    HPI  Weight loss, unintentional Continuous weight loss noted. Abnormal CT ABDOMEN/pelvis on 10/2022 HCPOA-sister Ruth Nelson denies any change in appetite, resolved constipation, she eats daily Ruth Nelson denies any N/V or ABDOMEN pain.  Advised to schedule appointment with GI as previously discussed Start drinking ensure 1-2bottles daily    CKD (chronic kidney disease) stage 4, GFR 15-29 ml/min (HCC) Followed by nephrology and urology Repeat CMP today  Age related osteoporosis Dexa 2022: The BMD measured at AP Spine L1-L4 is 0.733 g/cm2 with a T-score of -3.7. This patient is considered osteoporotic according to World Health Organization [facility]) criteria.  No hx of fracture Moderate fall risk.  HCPOA opted to  start prolia  injection instead of oral fosamax  Ruth Nelson has difficulty swallowing large pills) Prolia  PRESCRIPTION entered. Sent message to clinical lead and prior authorization team. Advised HCPOA to maintain OVER THE COUNTER calcium  and vit. D supplement.  Other specified hypothyroidism Repeat TSh and T4: stable Maintain levothyroxine  dose  Aortic atherosclerosis (HCC) Repeat lipid panel: elevated TC and LDL Start atorvastatin  10mg  in PM Repeat labs in 3-81months  Dementia with behavioral disturbance (HCC) Stable mood Daycare form will be completed  HTN (hypertension), benign BP at goal with amlodipine  No adverse effects BP Readings from Last 3 Encounters:  09/05/23 118/76  03/06/23 120/80  11/05/22 122/76    Maintain med dose   Past Medical History:  Diagnosis Date   High blood pressure    History reviewed. No pertinent surgical history. Social History   Socioeconomic History   Marital status: Single    Spouse name: Not on file   Number of children: Not on file   Years of education: Not on file   Highest education level: Not on file  Occupational History   Not on file  Tobacco Use   Smoking status: Never   Smokeless tobacco: Never  Vaping Use   Vaping status: Never Used  Substance and Sexual Activity   Alcohol use: Never   Drug use: Never   Sexual activity: Not Currently  Other Topics Concern   Not on file  Social History Narrative   Not on file   Social Drivers of Health   Financial Resource Strain: Low Risk  (05/16/2023)   Overall Financial Resource Strain (CARDIA)    Difficulty of Paying Living Expenses: Not hard at all  Food Insecurity: No Food Insecurity (05/16/2023)   Hunger Vital Sign    Worried About Running Out of Food in the Last Year: Never true    Ran Out of Food in the Last Year: Never true  Transportation Needs: No Transportation Needs (05/16/2023)   PRAPARE - Administrator, Civil Service (Medical): No    Lack of  Transportation (Non-Medical): No  Physical Activity: Sufficiently Active (05/16/2023)   Exercise Vital Sign    Days of Exercise per Week: 5 days    Minutes of Exercise per Session: 60 min  Stress: No Stress Concern Present (05/16/2023)   Ruth Nelson of Occupational Health - Occupational Stress Questionnaire    Feeling of Stress : Not at all  Social Connections: Moderately Isolated (05/16/2023)   Social Connection and Isolation Panel    Frequency of Communication with Friends and Family: Once a week    Frequency of Social Gatherings with Friends and Family: More than three times a week    Attends Religious Services: More than 4 times per year    Active Member of [employer] or Organizations: No    Attends Banker Meetings: Never    Marital Status: Never married  Intimate Partner Violence: Not At Risk (05/16/2023)   Humiliation, Afraid, Rape, and Kick questionnaire    Fear of Current or Ex-Partner: No    Emotionally Abused: No    Physically Abused: No    Sexually Abused: No   Family Status  Relation Name Status   Mother  Deceased   Father  Deceased   Neg Hx  (Not Specified)  No partnership data on file   Family History  Problem Relation Age of Onset   Pancreatic cancer Neg Hx    Stomach cancer Neg Hx    Esophageal cancer Neg Hx    Colon cancer Neg Hx    No Known Allergies  Patient Care Team: Andris Brothers, Roselie Rockford, NP as PCP - General (Internal Medicine)   Medications: Outpatient Medications Prior to Visit  Medication Sig   amLODipine  (NORVASC ) 5 MG tablet TAKE 1 TABLET BY MOUTH EVERY DAY   aspirin EC 81 MG tablet Take 81 mg by mouth daily. Swallow whole.   bisacodyl (DULCOLAX) 5 MG EC tablet Take 5 mg by mouth daily as needed for moderate constipation.   hydrocortisone  (PROCTOSOL HC) 2.5 % rectal cream Place 1 Application rectally 2 (two) times daily.   Multiple Vitamin (ONE-A-DAY ESSENTIAL PO) Take by mouth. Women's One-A-Day   [DISCONTINUED] citalopram  (CELEXA )  20 MG tablet Take 1 tablet (20 mg total) by mouth daily.   [DISCONTINUED] levothyroxine  (SYNTHROID ) 50 MCG tablet TAKE 1 TABLET BY MOUTH DAILY BEFORE BREAKFAST   [DISCONTINUED] PREVNAR 20 0.5 ML injection  (Patient not taking: Reported on 05/16/2023)   [DISCONTINUED] SHINGRIX injection  (Patient not taking: Reported on 05/16/2023)   No facility-administered medications prior to visit.    Review of Systems  Constitutional:  Positive for unexpected weight change. Negative for activity change and appetite change.  Respiratory: Negative.    Cardiovascular: Negative.   Gastrointestinal: Negative.   Endocrine: Negative for cold intolerance and heat intolerance.  Genitourinary: Negative.   Musculoskeletal: Negative.   Skin: Negative.   Neurological: Negative.   Hematological:  Negative.   Psychiatric/Behavioral:  Negative for behavioral problems, decreased concentration, dysphoric mood, hallucinations, self-injury, sleep disturbance and suicidal ideas. The patient is not nervous/anxious.         Objective:  BP 118/76 (BP Location: Left Arm, Patient Position: Sitting, Cuff Size: Small)   Pulse 88   Temp 97.9 F (36.6 C) (Oral)   Ht 5' (1.524 m)   Wt 92 lb 12.8 oz (42.1 kg)   SpO2 96%   BMI 18.12 kg/m     Physical Exam Vitals and nursing note reviewed.  Constitutional:      General: She is not in acute distress. HENT:     Head:     Comments: Normal TM and ear canal post cerumen removal    Right Ear: External ear normal. There is impacted cerumen.     Left Ear: External ear normal. There is impacted cerumen.     Nose: Nose normal.  Eyes:     Extraocular Movements: Extraocular movements intact.     Conjunctiva/sclera: Conjunctivae normal.     Pupils: Pupils are equal, round, and reactive to light.  Neck:     Thyroid : No thyroid  mass, thyromegaly or thyroid  tenderness.  Cardiovascular:     Rate and Rhythm: Normal rate and regular rhythm.     Pulses: Normal pulses.     Heart  sounds: Normal heart sounds.  Pulmonary:     Effort: Pulmonary effort is normal.     Breath sounds: Normal breath sounds.  Chest:  Breasts:    Breasts are symmetrical.     Right: Normal.     Left: Normal.  Abdominal:     General: Bowel sounds are normal.     Palpations: Abdomen is soft.  Musculoskeletal:        General: Normal range of motion.     Cervical back: Normal range of motion and neck supple.     Right lower leg: No edema.     Left lower leg: No edema.  Lymphadenopathy:     Cervical: No cervical adenopathy.     Upper Body:     Right upper body: No supraclavicular, axillary or pectoral adenopathy.     Left upper body: No supraclavicular, axillary or pectoral adenopathy.  Skin:    General: Skin is warm and dry.  Neurological:     Mental Status: She is alert and oriented to person, place, and time.     Cranial Nerves: No cranial nerve deficit.  Psychiatric:        Mood and Affect: Mood normal.        Behavior: Behavior normal.        Thought Content: Thought content normal.     Results for orders placed or performed in visit on 09/05/23  T4, free  Result Value Ref Range   Free T4 0.86 0.60 - 1.60 ng/dL  TSH  Result Value Ref Range   TSH 2.22 0.35 - 5.50 uIU/mL  Comprehensive metabolic panel with GFR  Result Value Ref Range   Sodium 142 135 - 145 mEq/L   Potassium 4.3 3.5 - 5.1 mEq/L   Chloride 104 96 - 112 mEq/L   CO2 30 19 - 32 mEq/L   Glucose, Bld 96 70 - 99 mg/dL   BUN 24 (H) 6 - 23 mg/dL   Creatinine, Ser 8.46 (H) 0.40 - 1.20 mg/dL   Total Bilirubin 0.6 0.2 - 1.2 mg/dL   Alkaline Phosphatase 68 39 - 117 U/L   AST 21 0 - 37  U/L   ALT 16 0 - 35 U/L   Total Protein 8.1 6.0 - 8.3 g/dL   Albumin 4.4 3.5 - 5.2 g/dL   GFR 68.77 (L) >39.99 mL/min   Calcium  10.0 8.4 - 10.5 mg/dL  CBC  Result Value Ref Range   WBC 4.2 4.0 - 10.5 K/uL   RBC 4.95 3.87 - 5.11 Mil/uL   Platelets 229.0 150.0 - 400.0 K/uL   Hemoglobin 14.5 12.0 - 15.0 g/dL   HCT 54.7 63.9 -  53.9 %   MCV 91.3 78.0 - 100.0 fl   MCHC 32.0 30.0 - 36.0 g/dL   RDW 87.2 88.4 - 84.4 %  VITAMIN D  25 Hydroxy (Vit-D Deficiency, Fractures)  Result Value Ref Range   VITD 44.62 30.00 - 100.00 ng/mL  Lipid panel  Result Value Ref Range   Cholesterol 257 (H) 0 - 200 mg/dL   Triglycerides 32.9 0.0 - 149.0 mg/dL   HDL 00.59 >60.99 mg/dL   VLDL 86.5 0.0 - 59.9 mg/dL   LDL Cholesterol 854 (H) 0 - 99 mg/dL   Total CHOL/HDL Ratio 3    NonHDL 158.04       Assessment & Plan:    Routine Health Maintenance and Physical Exam  Immunization History  Administered Date(s) Administered   Fluad Quad(high Dose 65+) 01/14/2020, 12/27/2020   Fluad Trivalent(High Dose 65+) 11/05/2022   Influenza,inj,Quad PF,6+ Mos 10/23/2021   Moderna Covid-19 Fall Seasonal Vaccine 66yrs & older 03/01/2022   Moderna Sars-Covid-2 Vaccination 03/28/2019, 04/25/2019, 02/28/2020   PFIZER Comirnaty(Gray Top)Covid-19 Tri-Sucrose Vaccine 08/04/2020   PFIZER(Purple Top)SARS-COV-2 Vaccination 01/23/2020   PNEUMOCOCCAL CONJUGATE-20 12/27/2020, 03/01/2022   Pfizer Covid-19 Vaccine Bivalent Booster 28yrs & up 02/12/2021   Unspecified SARS-COV-2 Vaccination 03/01/2022   Zoster Recombinant(Shingrix) 03/01/2022, 05/13/2022   Health Maintenance  Topic Date Due   COVID-19 Vaccine (8 - 2024-25 season) 10/27/2022   INFLUENZA VACCINE  09/26/2023   Medicare Annual Wellness (AWV)  05/15/2024   Pneumococcal Vaccine: 50+ Years  Completed   DEXA SCAN  Completed   Zoster Vaccines- Shingrix  Completed   Hepatitis B Vaccines  Aged Out   HPV VACCINES  Aged Out   Meningococcal B Vaccine  Aged Out   DTaP/Tdap/Td  Discontinued   Discussed health benefits of physical activity, and encouraged her to engage in regular exercise appropriate for her age and condition.  Procedure Note :  Procedure :  Ear irrigation Indication:  Cerumen impaction (bilateral) Risks, including pain, dizziness, eardrum perforation, bleeding, infection and  others as well as benefits were explained to the patient in detail. Verbal consent was obtained and the patient agreed to proceed.  We used [employer] Elephant Ear Irrigation Device filled with lukewarm water for irrigation. A large amount wax was recovered. Procedure has also required manual wax removal with an ear loop. Tolerated well. Complications: None. Postprocedure instructions :  Call if problems.   Problem List Items Addressed This Visit     Age related osteoporosis   Dexa 2022: The BMD measured at AP Spine L1-L4 is 0.733 g/cm2 with a T-score of -3.7. This patient is considered osteoporotic according to World Health Organization [facility]) criteria.  No hx of fracture Moderate fall risk.  HCPOA opted to start prolia  injection instead of oral fosamax  (Keosha has difficulty swallowing large pills) Prolia  PRESCRIPTION entered. Sent message to clinical lead and prior authorization team. Advised HCPOA to maintain OVER THE COUNTER calcium  and vit. D supplement.      Relevant Medications   denosumab  (PROLIA ) 60  MG/ML SOSY injection   Other Relevant Orders   VITAMIN D  25 Hydroxy (Vit-D Deficiency, Fractures) (Completed)   Aortic atherosclerosis (HCC)   Repeat lipid panel: elevated TC and LDL Start atorvastatin  10mg  in PM Repeat labs in 3-46months      Relevant Medications   atorvastatin  (LIPITOR) 10 MG tablet   Other Relevant Orders   Lipid panel (Completed)   CKD (chronic kidney disease) stage 4, GFR 15-29 ml/min (HCC)   Followed by nephrology and urology Repeat CMP today      Relevant Orders   Comprehensive metabolic panel with GFR (Completed)   CBC (Completed)   Dementia with behavioral disturbance (HCC)   Stable mood Daycare form will be completed      Relevant Medications   citalopram  (CELEXA ) 20 MG tablet   HTN (hypertension), benign   BP at goal with amlodipine  No adverse effects BP Readings from Last 3 Encounters:  09/05/23 118/76  03/06/23 120/80  11/05/22 122/76     Maintain med dose       Relevant Medications   atorvastatin  (LIPITOR) 10 MG tablet   Other specified hypothyroidism   Repeat TSh and T4: stable Maintain levothyroxine  dose      Relevant Medications   levothyroxine  (SYNTHROID ) 50 MCG tablet   Other Relevant Orders   T4, free (Completed)   TSH (Completed)   Pure hypercholesterolemia   Relevant Medications   atorvastatin  (LIPITOR) 10 MG tablet   Weight loss, unintentional   Continuous weight loss noted. Abnormal CT ABDOMEN/pelvis on 10/2022 HCPOA-sister Ruth Nelson denies any change in appetite, resolved constipation, she eats daily Mayrani denies any N/V or ABDOMEN pain.  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     Roselie Mood, NP

## 2023-09-05 NOTE — Assessment & Plan Note (Addendum)
 Dexa 2022: The BMD measured at AP Spine L1-L4 is 0.733 g/cm2 with a T-score of -3.7. This patient is considered osteoporotic according to World Health Organization [facility]) criteria.  No hx of fracture Moderate fall risk.  HCPOA opted to start prolia  injection instead of oral fosamax  Ruth Nelson has difficulty swallowing large pills) Prolia  PRESCRIPTION entered. Sent message to clinical lead and prior authorization team. Advised HCPOA to maintain OVER THE COUNTER calcium  and vit. D supplement.

## 2023-09-08 ENCOUNTER — Ambulatory Visit (INDEPENDENT_AMBULATORY_CARE_PROVIDER_SITE_OTHER)
Admission: RE | Admit: 2023-09-08 | Discharge: 2023-09-08 | Disposition: A | Discharge status: Home / Self Care | Source: Ambulatory Visit | Attending: Nurse Practitioner

## 2023-09-08 ENCOUNTER — Other Ambulatory Visit: Payer: Self-pay | Admitting: Nurse Practitioner

## 2023-09-08 ENCOUNTER — Telehealth: Payer: Self-pay | Admitting: Nurse Practitioner

## 2023-09-08 DIAGNOSIS — Z111 Encounter for screening for respiratory tuberculosis: Secondary | ICD-10-CM | POA: Diagnosis not present

## 2023-09-08 DIAGNOSIS — I10 Essential (primary) hypertension: Secondary | ICD-10-CM

## 2023-09-08 DIAGNOSIS — N184 Chronic kidney disease, stage 4 (severe): Secondary | ICD-10-CM | POA: Diagnosis not present

## 2023-09-08 NOTE — Telephone Encounter (Addendum)
 Called and left a voice message per DPR on file for patient's sister Ruth Nelson asking to give me a call back at the office at [phone].   ----- Message from Roselie Mood sent at 09/05/2023  2:51 PM EDT ----- Stable renal function. Maintain adequate oral hydration Stable Tsh and T4: maintain levothyroxine  dose Normal cbc and vit. D Elevated total cholesterol and LDL: with hx of aortic atherosclerosis, I recommend start atorvastatin  10mg  in PM to minimize risk of a cardiovascular event. New PRESCRIPTION sent.   ----- Message ----- From: Interface, [facility] Sent: 09/05/2023   2:12 PM EDT To: [name], NP

## 2023-09-08 NOTE — Telephone Encounter (Signed)
 Patient came into the office to pick up paper work. I faxed the paper work this morning with a note stating CXR pending.  Called patient's sister and informed her that I did fax the papers with the medication list this morning with a note pending CXR results. She informed me that at the time of call the CXR was being done now. I informed her that once Ruth Nelson gets those results I will fax them to [facility]. She thanked me for calling and stated that she only came to get the papers because when she called the requesting place to see if we had faxed them she was informed that they have not received anything from our office. She again thanked me for informing her that I did fax and and let Ruth Nelson know that I will fax the CXR results.

## 2023-09-08 NOTE — Telephone Encounter (Signed)
 Copied from CRM [id number]. Topic: General - Other >> Sep 08, 2023 11:21 AM Corin V wrote: Reason for CRM: Patient is calling to follow up on a medical report she needs signed off on by [employer]. She dropped it off on Friday and was told she could have it picked up today since it is due tomorrow. Please follow up if she can pick it up by 2pm today at [phone]

## 2023-09-08 NOTE — Telephone Encounter (Unsigned)
 Copied from CRM [id number]. Topic: General - Other >> Sep 08, 2023 11:21 AM Corin V wrote: Reason for CRM: Patient is calling to follow up on a medical report she needs signed off on by [employer]. She dropped it off on Friday and was told she could have it picked up today since it is due tomorrow. Please follow up if she can pick it up by 2pm today at [phone]

## 2023-09-09 ENCOUNTER — Other Ambulatory Visit (HOSPITAL_COMMUNITY): Payer: Self-pay

## 2023-09-09 ENCOUNTER — Telehealth: Payer: Self-pay

## 2023-09-09 NOTE — Telephone Encounter (Signed)
 PHARMACY BENEFIT: REFILL TOO SOON. LAST FILLED 09/05/23, NEXT FILL 02/02/24

## 2023-09-09 NOTE — Telephone Encounter (Signed)
    [employer] can no longer give estimates/quotes on non-chemotherapy drugs. Provider must follow the Fee schedule or use CMS website.   Following Medicare Advantage guideline: 20% coinsurance, PA required for medical coverage

## 2023-09-09 NOTE — Telephone Encounter (Signed)
 Prolia  VOB initiated via [email or web address]  Next Prolia  inj DUE: NEW START   PHARMACY BENEFIT: $ REFILL TOO SOON. LAST FILLED 09/05/23, NEXT FILL 02/02/24

## 2023-09-09 NOTE — Telephone Encounter (Unsigned)
 Copied from CRM [id number]. Topic: General - Other >> Sep 09, 2023  1:30 PM Mercedes MATSU wrote: Reason for CRM: Patient called in requesting to speak with Rexene. Patient can be reached at [phone].

## 2023-09-09 NOTE — Telephone Encounter (Signed)
Addressed in a results encounter.

## 2023-09-10 ENCOUNTER — Telehealth: Payer: Self-pay

## 2023-09-10 NOTE — Telephone Encounter (Signed)
 Called patient's sister Zoila Gordon who is on the DPR back and informed her of Charlotte's comments of yes they can both be taken. She thanked me for calling back.

## 2023-09-10 NOTE — Telephone Encounter (Signed)
 Copied from CRM [id number]. Topic: Clinical - Medication Question >> Sep 09, 2023  4:44 PM Ruth Nelson wrote: Reason for CRM: Pt is calling asking if both medications are ok to take during the evening together or if they should be spaced apart.  amLODipine  (NORVASC ) 5 MG tablet & atorvastatin  (LIPITOR) 10 MG tablet

## 2023-09-12 ENCOUNTER — Telehealth: Payer: Self-pay | Admitting: Nurse Practitioner

## 2023-09-12 NOTE — Telephone Encounter (Signed)
 Copied from CRM [id number]. Topic: General - Other >> Sep 12, 2023  2:20 PM Melissa C wrote: Reason for CRM: [facility] called to confirm delivery date of medication for patient will be July 24th, asked for confirmation of clinic address which was given. No time was given for delivery, just stated that no signature will be required.

## 2023-09-15 DIAGNOSIS — N1832 Chronic kidney disease, stage 3b: Secondary | ICD-10-CM | POA: Diagnosis not present

## 2023-09-15 DIAGNOSIS — I129 Hypertensive chronic kidney disease with stage 1 through stage 4 chronic kidney disease, or unspecified chronic kidney disease: Secondary | ICD-10-CM | POA: Diagnosis not present

## 2023-09-15 DIAGNOSIS — N2581 Secondary hyperparathyroidism of renal origin: Secondary | ICD-10-CM | POA: Diagnosis not present

## 2023-09-15 DIAGNOSIS — D631 Anemia in chronic kidney disease: Secondary | ICD-10-CM | POA: Diagnosis not present

## 2023-09-18 ENCOUNTER — Telehealth: Payer: Self-pay

## 2023-09-18 NOTE — Telephone Encounter (Signed)
 Our office has received pts Prolia . Is authorization complete for coverage?

## 2023-09-18 NOTE — Telephone Encounter (Addendum)
 PA not required for pharmacy benefit. Patient has already paid for Prolia  at pharmacy. Authorization is complete.

## 2023-09-18 NOTE — Telephone Encounter (Signed)
 Prolia  60 mg/mL medication was delivered to our office. Placed in the refrigerator.

## 2023-09-19 NOTE — Telephone Encounter (Signed)
 Called patient's sister Zoila Gordon who is on DPR on file and informed her that the Prolia  medication has been delivered to our office and her sister will need to get scheduled for a nurse visit to receive the injection. She stated that 09/26/23 works best for her and the morning time. I informed her that I can schedule for 09/26/23 unfortunately it will have to be an afternoon time slot. She verbalized that early afternoon if possible. Patient is now scheduled for Friday 09/26/23 at 2:20 PM. She verbalized understanding and all if any questions have been answered.

## 2023-09-19 NOTE — Telephone Encounter (Addendum)
 Called patient's sister Ruth Nelson who is on DPR on file and informed her that the Prolia  medication has been delivered to our office and her sister will need to get scheduled for a nurse visit to receive the injection. She stated that 09/26/23 works best for her and the morning time. I informed her that I can schedule for 09/26/23 unfortunately it will have to be an afternoon time slot. She verbalized that early afternoon if possible. Patient is now scheduled for Friday 09/26/23 at 2:20 PM. She verbalized understanding and all if any questions have been answered.

## 2023-09-26 ENCOUNTER — Ambulatory Visit

## 2023-09-26 DIAGNOSIS — M810 Age-related osteoporosis without current pathological fracture: Secondary | ICD-10-CM | POA: Diagnosis not present

## 2023-09-26 MED ORDER — DENOSUMAB 60 MG/ML ~~LOC~~ SOSY: 60.0000 mg | PREFILLED_SYRINGE | Freq: Once | SUBCUTANEOUS | 0 refills | Status: DC

## 2023-09-26 MED ORDER — DENOSUMAB 60 MG/ML ~~LOC~~ SOSY
60.0000 mg | PREFILLED_SYRINGE | Freq: Once | SUBCUTANEOUS | Status: AC
  Administered 2023-09-26: 60 mg via SUBCUTANEOUS

## 2023-09-26 NOTE — Progress Notes (Signed)
 Per orders of Rosina Senters, injection of Prolia  given  in RT arm by Karna Christ, cma.  Patient tolerated injection well.

## 2023-10-28 NOTE — Progress Notes (Deleted)
     10/28/2023 Ruth Nelson [id number] [birth year]   Chief Complaint:  History of Present Illness: Ruth Nelson is an 83 year old female with a past medical history of dementia, hypertension and CKD stage IV. She was initially seen in office by Alan Coombs PA-C 03/06/2023 for further evaluation regarding 13 lb weight loss. CT scan showed thickening and inflammation in the stomach and unremarkable colon other than constipation. Endoscopic procedures were declined per sister/POA, patient with significant dementia.  Dr. Stacia assigned GI     Latest Ref Rng & Units 09/05/2023   10:36 AM 03/06/2023   10:11 AM 09/25/2021   10:41 AM  CBC  WBC 4.0 - 10.5 K/uL 4.2  3.5  4.4   Hemoglobin 12.0 - 15.0 g/dL 85.4  85.4  85.5   Hematocrit 36.0 - 46.0 % 45.2  44.8  45.1   Platelets 150.0 - 400.0 K/uL 229.0  263.0  299.0         Latest Ref Rng & Units 09/05/2023   10:36 AM 03/06/2023   10:11 AM 08/22/2022    8:15 AM  CMP  Glucose 70 - 99 mg/dL 96  88  77   BUN 6 - 23 mg/dL 24  21  22    Creatinine 0.40 - 1.20 mg/dL 8.46  8.41  8.31   Sodium 135 - 145 mEq/L 142  140  141   Potassium 3.5 - 5.1 mEq/L 4.3  4.5  4.9   Chloride 96 - 112 mEq/L 104  103  104   CO2 19 - 32 mEq/L 30  28  30    Calcium  8.4 - 10.5 mg/dL 89.9  9.7  9.8   Total Protein 6.0 - 8.3 g/dL 8.1  8.0    Total Bilirubin 0.2 - 1.2 mg/dL 0.6  0.4    Alkaline Phos 39 - 117 U/L 68  65    AST 0 - 37 U/L 21  19    ALT 0 - 35 U/L 16  13       Past Medical History:  Diagnosis Date   High blood pressure    No past surgical history on file.     Current Medications, Allergies, Past Medical History, Past Surgical History, Family History and Social History were reviewed in [employer] record.   Review of Systems:   Constitutional: Negative for fever, sweats, chills or weight loss.  Respiratory: Negative for shortness of breath.   Cardiovascular: Negative for chest pain, palpitations and leg  swelling.  Gastrointestinal: See HPI.  Musculoskeletal: Negative for back pain or muscle aches.  Neurological: Negative for dizziness, headaches or paresthesias.    Physical Exam: There were no vitals taken for this visit. General: in no acute distress. Head: Normocephalic and atraumatic. Eyes: No scleral icterus. Conjunctiva pink . Ears: Normal auditory acuity. Mouth: Dentition intact. No ulcers or lesions.  Lungs: Clear throughout to auscultation. Heart: Regular rate and rhythm, no murmur. Abdomen: Soft, nontender and nondistended. No masses or hepatomegaly. Normal bowel sounds x 4 quadrants.  Rectal: Deferred.  Musculoskeletal: Symmetrical with no gross deformities. Extremities: No edema. Neurological: Alert oriented x 4. No focal deficits.  Psychological: Alert and cooperative. Normal mood and affect  Assessment and Recommendations: ***

## 2023-10-30 ENCOUNTER — Ambulatory Visit: Admitting: Nurse Practitioner

## 2023-11-04 ENCOUNTER — Encounter: Payer: Self-pay | Admitting: Nurse Practitioner

## 2023-11-04 ENCOUNTER — Ambulatory Visit (INDEPENDENT_AMBULATORY_CARE_PROVIDER_SITE_OTHER): Admitting: Nurse Practitioner

## 2023-11-04 VITALS — BP 124/82 | HR 70 | Ht 60.0 in | Wt 96.5 lb

## 2023-11-04 DIAGNOSIS — R634 Abnormal weight loss: Secondary | ICD-10-CM

## 2023-11-04 DIAGNOSIS — R194 Change in bowel habit: Secondary | ICD-10-CM

## 2023-11-04 NOTE — Patient Instructions (Addendum)
 Increase Boost or Ensure to two cans daily   Follow up with Dr. Katheen in 6 week to recheck weight   Follow up in our office as needed    Please follow up sooner if symptoms increase or worsen  Due to recent changes in healthcare laws, you may see the results of your imaging and laboratory studies on MyChart before your provider has had a chance to review them.  We understand that in some cases there may be results that are confusing or concerning to you. Not all laboratory results come back in the same time frame and the provider may be waiting for multiple results in order to interpret others.  Please give us  48 hours in order for your provider to thoroughly review all the results before contacting the office for clarification of your results.   Thank you for trusting me with your gastrointestinal care!   Elida Shawl, NP _______________________________________________________  If your blood pressure at your visit was 140/90 or greater, please contact your primary care physician to follow up on this.  _______________________________________________________  If you are age 49 or older, your body mass index should be between 23-30. Your Body mass index is 18.85 kg/m. If this is out of the aforementioned range listed, please consider follow up with your Primary Care Provider.  If you are age 48 or younger, your body mass index should be between 19-25. Your Body mass index is 18.85 kg/m. If this is out of the aformentioned range listed, please consider follow up with your Primary Care Provider.   ________________________________________________________  The [facility] providers would like to encourage you to use MYCHART to communicate with providers for non-urgent requests or questions.  Due to long hold times on the telephone, sending your provider a message by [facility] may be a faster and more efficient way to get a response.  Please allow 48 business hours for a response.  Please  remember that this is for non-urgent requests.  _______________________________________________________

## 2023-11-04 NOTE — Progress Notes (Signed)
 11/04/2023 Ruth Nelson [id number] 09-Sep-1940   Chief Complaint: Weight loss  History of Present Illness: 83 y.o. female with a past medical history of hypertension, aortic atherosclerosis, hypothyroidism, dementia, CKD stage IV and chronic constipation. She was initially seen in office by Alan Coombs PA-C 03/06/2023 due to having unexplained 13lb weight loss with recent loose stools and prior chronic constipation. Patient with history of dementia and presented with her sister/POA. Prior CTAP 10/2022 was reviewed which showed thickening and inflammation in the stomach and evidence of constipation otherwise was unremarkable. An EGD and colonoscopy were considered, however the patient's sister/POA and the patient did not wish to pursue any endoscopic evaluation at that time. [name] presents today accompanied by her sister/POA for further evaluation. Her weight was 100 lbs on 03/06/2023, 92 lbs on 09/05/2023 and today's weight 96lbs. She lives with her sister and attends adult daycare. She eats grits and eggs for breakfast most mornings. She eats lunch at adult daycare and dinner consist of a protein, 2 vegetables and an Ensure or boost x 1. She snacks on chocolate chip cookies or cake.  She denies having any nausea or vomiting.  No upper or lower abdominal pain.  She passes a brown formed or loose stool most days.  No bloody or black stools.  Her sister sometimes sees her bowel movements the same findings.  She does not take any laxatives or fiber supplement.  She ambulates without any difficulty and her sister stated she remains fairly active and talkative with neighbors and adult daycare acquaintances.     Latest Ref Rng & Units 09/05/2023   10:36 AM 03/06/2023   10:11 AM 09/25/2021   10:41 AM  CBC  WBC 4.0 - 10.5 K/uL 4.2  3.5  4.4   Hemoglobin 12.0 - 15.0 g/dL 85.4  85.4  85.5   Hematocrit 36.0 - 46.0 % 45.2  44.8  45.1   Platelets 150.0 - 400.0 K/uL 229.0  263.0  299.0         Latest Ref Rng & Units 09/05/2023   10:36 AM 03/06/2023   10:11 AM 08/22/2022    8:15 AM  CMP  Glucose 70 - 99 mg/dL 96  88  77   BUN 6 - 23 mg/dL 24  21  22    Creatinine 0.40 - 1.20 mg/dL 8.46  8.41  8.31   Sodium 135 - 145 mEq/L 142  140  141   Potassium 3.5 - 5.1 mEq/L 4.3  4.5  4.9   Chloride 96 - 112 mEq/L 104  103  104   CO2 19 - 32 mEq/L 30  28  30    Calcium  8.4 - 10.5 mg/dL 89.9  9.7  9.8   Total Protein 6.0 - 8.3 g/dL 8.1  8.0    Total Bilirubin 0.2 - 1.2 mg/dL 0.6  0.4    Alkaline Phos 39 - 117 U/L 68  65    AST 0 - 37 U/L 21  19    ALT 0 - 35 U/L 16  13    TSH 2.22 on 09/05/2023  11/08/2022 CT abdomen pelvis with contrast for bright red blood in stool diarrhea and weight loss showed nonspecific mural thickening of gastric fundus and cardia, moderate fecal retention throughout the colon consistent with constipation no bowel obstruction or ileus, stable left renal atrophy and 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 Swallow whole.     atorvastatin  (LIPITOR) 10 MG tablet Take 1 tablet (10 mg total) by mouth every evening. 90 tablet 1   bisacodyl (DULCOLAX) 5 MG EC tablet Take 5 mg by mouth daily as needed for moderate constipation.     citalopram  (CELEXA ) 20 MG tablet Take 1 tablet (20 mg total) by mouth daily. 90 tablet 3   denosumab  (PROLIA ) 60 MG/ML SOSY injection Inject 60 mg into the skin every 6 (six) months. 1 mL 1   hydrocortisone  (PROCTOSOL HC) 2.5 % rectal cream Place 1 Application rectally 2 (two) times daily. 30 g 0   levothyroxine  (SYNTHROID ) 50 MCG tablet Take 1 tablet (50 mcg total) by mouth daily before breakfast. 90 tablet 1   Multiple Vitamin (ONE-A-DAY ESSENTIAL PO) Take by mouth. Women's One-A-Day     No current facility-administered  medications on file prior to visit.   No Known Allergies   Current Medications, Allergies, Past Medical History, Past Surgical History, Family History and Social History were reviewed in [employer] record.  Review of Systems:   Constitutional:See HPI. Respiratory: Negative for shortness of breath.   Cardiovascular: Negative for chest pain, palpitations and leg swelling.  Gastrointestinal: See HPI.  Musculoskeletal: Negative for back pain or muscle aches.  Neurological: Negative for dizziness, headaches or paresthesias.   Physical Exam: There were no vitals taken for this visit. BP 124/82   Pulse 70   Ht 5' (1.524 m)   Wt 96 lb 8 oz (43.8 kg)   BMI 18.85 kg/m   Wt Readings from Last 3 Encounters:  11/04/23 96 lb 8 oz (43.8 kg)  09/05/23 92 lb 12.8 oz (42.1 kg)  03/06/23 100 lb 4 oz (45.5 kg)    General: Pleasant 83 year old female smiling, in no acute distress. Head: Normocephalic and atraumatic. Eyes: No scleral icterus. Conjunctiva pink . Ears: Normal auditory acuity. Mouth: Upper and lower dentures intact.  No ulcers or lesions.  Lungs: Clear throughout to auscultation. Heart: Regular rate and rhythm, no murmur. Abdomen: Soft, nontender.  Lower abdomen slightly protuberant but not distended. No masses or hepatomegaly. Normal bowel sounds x 4 quadrants.  Rectal: Deferred.  Musculoskeletal: Symmetrical with no gross deformities. Extremities: No edema. Neurological: Alert oriented x 1. No focal deficits.  Psychological: Alert and cooperative. Normal mood and affect  Assessment and Recommendations:  83 year old female with previously reported unexplained 13 pound weight loss over the past year. CTAP 10/2022 CT showed thickening and inflammation in the stomach and unremarkable colon other than constipation. No family history of colon cancer.  - EGD and colonoscopy benefits and risks discussed including risk with sedation, risk of bleeding, perforation  and infection discussed with patient and [name] (sister/POA), they do not wish to pursue any invasive endoscopic evaluation at this juncture - No recommendations for repeat CTAP as patient is asymptomatic and weight increased from 92  lbs up to 96 lbs July to Sept 2025 - Recommend 3-4 snack size meals daily and Ensure or boost 1 can twice daily - Patient to follow-up with her PCP for weight recheck in 6 weeks - Follow-up in our office as needed  Variable bowel pattern, solid or loose stools most days.  No bloody or black stools. - Recommend fiber supplement of choice as tolerated - Drink 6 to 8 glasses of water daily

## 2023-11-09 NOTE — Progress Notes (Signed)
 Agree with the assessment and plan as outlined by Elida Shawl, NP.  Agree with not pursuing invasive evaluation.  Very low likelihood of GI malignancy causing her weight loss given chronicity and with recent weight gain.      Eulanda Dorion E. Stacia, MD [facility]

## 2023-11-21 ENCOUNTER — Ambulatory Visit: Admitting: Nurse Practitioner

## 2023-11-21 ENCOUNTER — Encounter: Payer: Self-pay | Admitting: Nurse Practitioner

## 2023-11-21 VITALS — BP 124/66 | HR 87 | Temp 98.6°F | Ht 62.0 in | Wt 97.2 lb

## 2023-11-21 DIAGNOSIS — R4689 Other symptoms and signs involving appearance and behavior: Secondary | ICD-10-CM

## 2023-11-21 DIAGNOSIS — Z23 Encounter for immunization: Secondary | ICD-10-CM | POA: Diagnosis not present

## 2023-11-21 DIAGNOSIS — F03918 Unspecified dementia, unspecified severity, with other behavioral disturbance: Secondary | ICD-10-CM | POA: Diagnosis not present

## 2023-11-21 MED ORDER — CITALOPRAM HYDROBROMIDE 40 MG PO TABS: 40.0000 mg | ORAL_TABLET | Freq: Every day | ORAL | 1 refills | Status: AC

## 2023-11-21 NOTE — Assessment & Plan Note (Addendum)
 Ruth Nelson states she has receives several reports from the senoir center staff about disruptive behavior (verbal impulsivity) (e.g. calling out car which arrive to the center, attempt to direct other clients on what to do, refusing to follow instructions). This typically occurs at the end of the day (2pm). Staff request for ativan/lorazepam to be prescribed. Ruth Nelson denies any report of physical altercation or verbal threat or violent behavior. Current use of celexa  20mg  daily.  [name] denies any pain and does not appear in distress. When asked about the senoir center, she states she enjoys going to the center. She states she gets along with everyone. She states she has friends but unable to identify then by name. She states she enjoys playing games. I advised against use of an benzodiazepine due to potential adverse effects. I recommend discussing with program director about implementing an activity for Ruth Nelson during that time of day to serve at a distraction. I also increased celexa  dose to 40mg  daily F/up in 1months

## 2023-11-21 NOTE — Progress Notes (Addendum)
 Established Patient Visit  Patient: Ruth Nelson   DOB: 09-20-1940   83 y.o. Female  MRN: [id number] Visit Date: 11/21/2023  Subjective:    Chief Complaint  Patient presents with   Medication Management    Text from Adult center about medication suggestion  Wants Flu vaccine    HPI Accompanied by sister and guardian-Ruth Nelson.  Dementia with behavioral disturbance (HCC) Ruth states she has receives several reports from the [facility] center staff about disruptive behavior (verbal impulsivity) (e.g. calling out car which arrive to the center, attempt to direct other clients on what to do, refusing to follow instructions). This typically occurs at the end of the day (2pm). Staff request for ativan/lorazepam to be prescribed. Ruth denies any report of physical altercation or verbal threat or violent behavior. Current use of celexa  20mg  daily.  Ruth Nelson denies any pain and does not appear in distress. When asked about the senoir center, she states she enjoys going to the center. She states she gets along with everyone. She states she has friends but unable to identify then by name. She states she enjoys playing games. I advised against use of an benzodiazepine due to potential adverse effects. I recommend discussing with program director about implementing an activity for Ruth during that time of day to serve at a distraction. I also increased celexa  dose to 40mg  daily F/up in 1months  Wt Readings from Last 3 Encounters:  11/21/23 97 lb 3.2 oz (44.1 kg)  11/04/23 96 lb 8 oz (43.8 kg)  09/05/23 92 lb 12.8 oz (42.1 kg)    Reviewed medical, surgical, and social history today  Medications: Outpatient Medications Prior to Visit  Medication Sig   amLODipine  (NORVASC ) 5 MG tablet TAKE 1 TABLET BY MOUTH EVERY DAY   aspirin EC 81 MG tablet Take 81 mg by mouth daily. Swallow whole.   atorvastatin  (LIPITOR) 10 MG tablet Take 1 tablet (10 mg total) by mouth every  evening.   bisacodyl (DULCOLAX) 5 MG EC tablet Take 5 mg by mouth daily as needed for moderate constipation.   denosumab  (PROLIA ) 60 MG/ML SOSY injection Inject 60 mg into the skin every 6 (six) months.   hydrocortisone  (PROCTOSOL HC) 2.5 % rectal cream Place 1 Application rectally 2 (two) times daily. (Patient taking differently: Place 1 Application rectally as needed for anal itching or hemorrhoids.)   levothyroxine  (SYNTHROID ) 50 MCG tablet Take 1 tablet (50 mcg total) by mouth daily before breakfast.   Multiple Vitamin (ONE-A-DAY ESSENTIAL PO) Take by mouth. Women's One-A-Day   [DISCONTINUED] citalopram  (CELEXA ) 20 MG tablet Take 1 tablet (20 mg total) by mouth daily.   No facility-administered medications prior to visit.   Reviewed past medical and social history.   ROS per HPI above      Objective:  BP 124/66 (BP Location: Left Arm, Patient Position: Sitting, Cuff Size: Normal)   Pulse 87   Temp 98.6 F (37 C) (Oral)   Ht 5' 2 (1.575 m)   Wt 97 lb 3.2 oz (44.1 kg)   SpO2 100%   BMI 17.78 kg/m      Physical Exam Vitals and nursing note reviewed.  Neurological:     Mental Status: She is alert.  Psychiatric:        Attention and Perception: Attention normal.        Mood and Affect: Mood and affect normal.  Speech: Speech normal.        Behavior: Behavior is cooperative.        Cognition and Memory: Cognition is impaired. Memory is impaired.     No results found for any visits on 11/21/23.    Assessment & Plan:    Problem List Items Addressed This Visit     Dementia with behavioral disturbance (HCC)   Ruth states she has receives several reports from the [facility] center staff about disruptive behavior (verbal impulsivity) (e.g. calling out car which arrive to the center, attempt to direct other clients on what to do, refusing to follow instructions). This typically occurs at the end of the day (2pm). Staff request for ativan/lorazepam to be prescribed. Ruth  denies any report of physical altercation or verbal threat or violent behavior. Current use of celexa  20mg  daily.  Ruth Nelson denies any pain and does not appear in distress. When asked about the [facility]oir center, she states she enjoys going to the center. She states she gets along with everyone. She states she has friends but unable to identify then by name. She states she enjoys playing games. I advised against use of an benzodiazepine due to potential adverse effects. I recommend discussing with program director about implementing an activity for Ruth during that time of day to serve at a distraction. I also increased celexa  dose to 40mg  daily F/up in 1months      Relevant Medications   citalopram  (CELEXA ) 40 MG tablet   Other Visit Diagnoses       Immunization due    -  Primary   Relevant Orders   Flu vaccine HIGH DOSE PF(Fluzone Trivalent) (Completed)     Aggressive behavior of adult       Relevant Medications   citalopram  (CELEXA ) 40 MG tablet      Return in about 4 weeks (around 12/19/2023) for anxiety.     Roselie Mood, NP

## 2023-12-19 ENCOUNTER — Ambulatory Visit: Admitting: Nurse Practitioner

## 2024-01-07 ENCOUNTER — Telehealth: Payer: Self-pay | Admitting: Pharmacist

## 2024-01-07 NOTE — Progress Notes (Unsigned)
 Pharmacy Quality Measure Review  This patient is appearing on a report for being at risk of failing the adherence measure for cholesterol (statin) medications this calendar year.   Medication: atorvastatin  10 mg daily  Last fill date: 12/20/2023 for 90 day supply  Insurance report was not up to date. No action needed at this time.   Catie IVAR [facility], PharmD, [facility] Clinical Pharmacist [phone]

## 2024-02-27 ENCOUNTER — Other Ambulatory Visit: Payer: Self-pay | Admitting: Nurse Practitioner

## 2024-02-27 DIAGNOSIS — E038 Other specified hypothyroidism: Secondary | ICD-10-CM

## 2024-03-04 ENCOUNTER — Other Ambulatory Visit (HOSPITAL_COMMUNITY): Payer: Self-pay

## 2024-03-04 ENCOUNTER — Telehealth: Payer: Self-pay

## 2024-03-04 DIAGNOSIS — M810 Age-related osteoporosis without current pathological fracture: Secondary | ICD-10-CM

## 2024-03-04 NOTE — Telephone Encounter (Addendum)
 Prolia  VOB initiated via [email or web address]  Next Prolia  inj DUE: 03/28/24   PHARMACY COPAY: $4.90

## 2024-03-05 ENCOUNTER — Other Ambulatory Visit (HOSPITAL_COMMUNITY): Payer: Self-pay

## 2024-03-05 NOTE — Telephone Encounter (Signed)
 MEDICAL PA SUBMITTED VIA LATENT. KEY: [id number]

## 2024-03-05 NOTE — Telephone Encounter (Signed)
" ° °  [employer] cannot give estimate, following Medicare Advantage guideline (20% coinsurance, 20% admin fee)   "

## 2024-03-08 ENCOUNTER — Other Ambulatory Visit (HOSPITAL_COMMUNITY): Payer: Self-pay

## 2024-03-08 NOTE — Telephone Encounter (Addendum)
 Pt ready for scheduling for PROLIA  on or after : 03/28/24  Option# 1: Buy/Bill (Office supplied medication)  Out-of-pocket cost due at time of clinic visit: $377 following Medicare Advantage guideline (20% coinsurance, 20% admin fee)   Number of injection/visits approved: 2  Primary: [employer] Prolia  co-insurance: 20% Admin fee co-insurance: 20%  Secondary: --- Prolia  co-insurance:  Admin fee co-insurance:   Medical Benefit Details: Date Benefits were checked: 03/04/24 Deductible: NO/ Coinsurance: 20%/ Admin Fee: 20%  Prior Auth: APPROVED PA# [id number] Expiration Date: 03/26/24-02/24/25  # of doses approved: 2 ----------------------------------------------------------------------- Option# 2- Med Obtained from pharmacy:  Pharmacy benefit: Copay $4.90 (Paid to pharmacy) Admin Fee: 20% (Pay at clinic)  Prior Auth: N/A PA# Expiration Date:   # of doses approved:   If patient wants fill through the pharmacy benefit please send prescription to: [facility], and include estimated need by date in rx notes. Pharmacy will ship medication directly to the office.  Patient NOT eligible for Prolia  Copay Card. Copay Card can make patient's cost as little as $25. Link to apply: [email or web address]://[email or web address].[email or web address]/copay  ** This summary of benefits is an estimation of the patient's out-of-pocket cost. Exact cost may very based on individual plan coverage.

## 2024-03-13 ENCOUNTER — Other Ambulatory Visit: Payer: Self-pay | Admitting: Nurse Practitioner

## 2024-03-13 DIAGNOSIS — E7800 Pure hypercholesterolemia, unspecified: Secondary | ICD-10-CM

## 2024-03-13 DIAGNOSIS — I700 Atherosclerosis of aorta: Secondary | ICD-10-CM

## 2024-03-13 DIAGNOSIS — I10 Essential (primary) hypertension: Secondary | ICD-10-CM

## 2024-03-15 NOTE — Telephone Encounter (Signed)
 Called patient's sister Zoila Gordon who is on DPR on file. I informed her that our office received refill request on her sisters Atorvastatin  10 and the Amlodipine  5 mg. I informed her that at her sisters last appointment in Sept Charlotte wanted her to follow up in 4 weeks. The appointment was scheduled for 12/19/23 and it is down as a No Show. She apologized for the that missed appointment. We got her sister scheduled for an appointment and added to the wait-list for sooner appointment when possible. Informed her that I will send a 30 supply with 1 refill until she sees Charlotte. She thanked me for calling

## 2024-03-31 MED ORDER — DENOSUMAB 60 MG/ML ~~LOC~~ SOSY: 60.0000 mg | PREFILLED_SYRINGE | SUBCUTANEOUS | 1 refills | Status: AC

## 2024-03-31 NOTE — Addendum Note (Signed)
 Addended by: LENON ROUGHEN on: 03/31/2024 03:22 PM   Modules accepted: Orders

## 2024-03-31 NOTE — Addendum Note (Signed)
 Addended by: ALESSANDRA DEDRA PARAS on: 03/31/2024 03:05 PM   Modules accepted: Orders

## 2024-03-31 NOTE — Telephone Encounter (Signed)
 Patient's sister Ruth Nelson who is on DPR on file was called and informed of the Prolia  options for her sister Ruth Nelson. Sister chose option 2 which is to pay the copay of 4.90 to pharmacy and thd 20% admin fee to the clinic. Rx order was placed and informed patient's sister Ruth Nelson that once the office receives the medication someone will be in contact with her to schedule the nurse visit appointment. She thanked me for calling.

## 2024-05-18 ENCOUNTER — Ambulatory Visit

## 2024-05-21 ENCOUNTER — Ambulatory Visit: Admitting: Nurse Practitioner

## 2024-05-21 ENCOUNTER — Ambulatory Visit
